# Patient Record
Sex: Male | Born: 1945
Health system: Southern US, Community
[De-identification: ages and names within clinical notes are randomized; demographics above are authoritative.]

## PROBLEM LIST (undated history)

## (undated) DIAGNOSIS — I10 Essential (primary) hypertension: Secondary | ICD-10-CM

## (undated) HISTORY — DX: Essential (primary) hypertension: I10

---

## 2002-06-10 ENCOUNTER — Encounter: Payer: Self-pay | Admitting: Family Medicine

## 2002-06-10 ENCOUNTER — Encounter: Admission: RE | Admit: 2002-06-10 | Discharge: 2002-06-10 | Payer: Self-pay | Admitting: Family Medicine

## 2005-06-23 ENCOUNTER — Encounter: Admission: RE | Admit: 2005-06-23 | Discharge: 2005-06-23 | Payer: Self-pay | Admitting: Family Medicine

## 2006-07-07 ENCOUNTER — Ambulatory Visit (HOSPITAL_COMMUNITY): Admission: RE | Admit: 2006-07-07 | Discharge: 2006-07-07 | Payer: Self-pay | Admitting: Family Medicine

## 2006-07-07 ENCOUNTER — Encounter (INDEPENDENT_AMBULATORY_CARE_PROVIDER_SITE_OTHER): Payer: Self-pay | Admitting: Cardiology

## 2006-09-24 IMAGING — CR DG CERVICAL SPINE COMPLETE 4+V
6 series · 6 of 6 positions shown · non-contrast
Comparison: none

CLINICAL DATA: Intermittent pain and tingling left upper extremity, left shoulder, and elbow pain.  Left neck pain.  Loss of strength in left arm. 
CERVICAL SPINE SERIES ? 6 VIEW:
Disc space narrowing at C6-7 and to a lesser degree at C5-6.   Vertebral body osteophytic formation mainly off of the posteroinferior aspects of C-5 and C-6 bodies.  Sagittal dimension bony central canal appears adequate.  ild foraminal encroachment by osteophytes at C4-5, C5-6, and C6-7 on the right and on the left, mainly at C5-6 and to a lesser degree at C6-7.  No subluxation.

[view not recorded (1 of 6)]
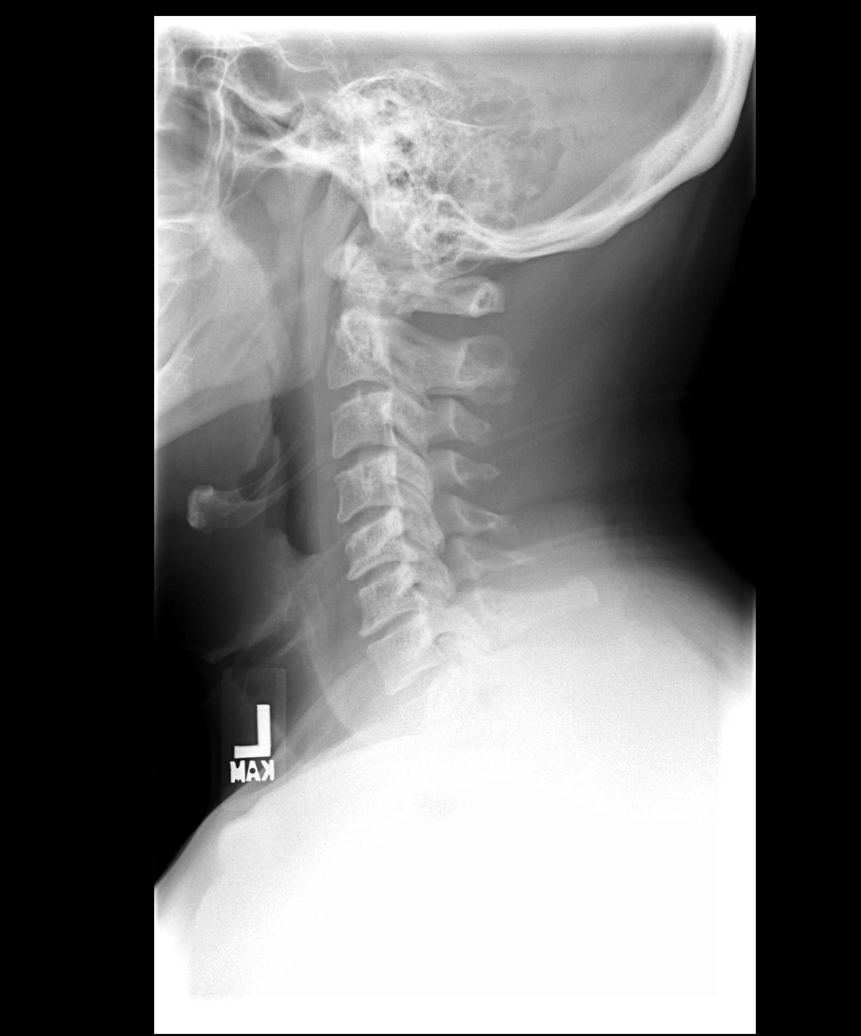

[view not recorded (2 of 6)]
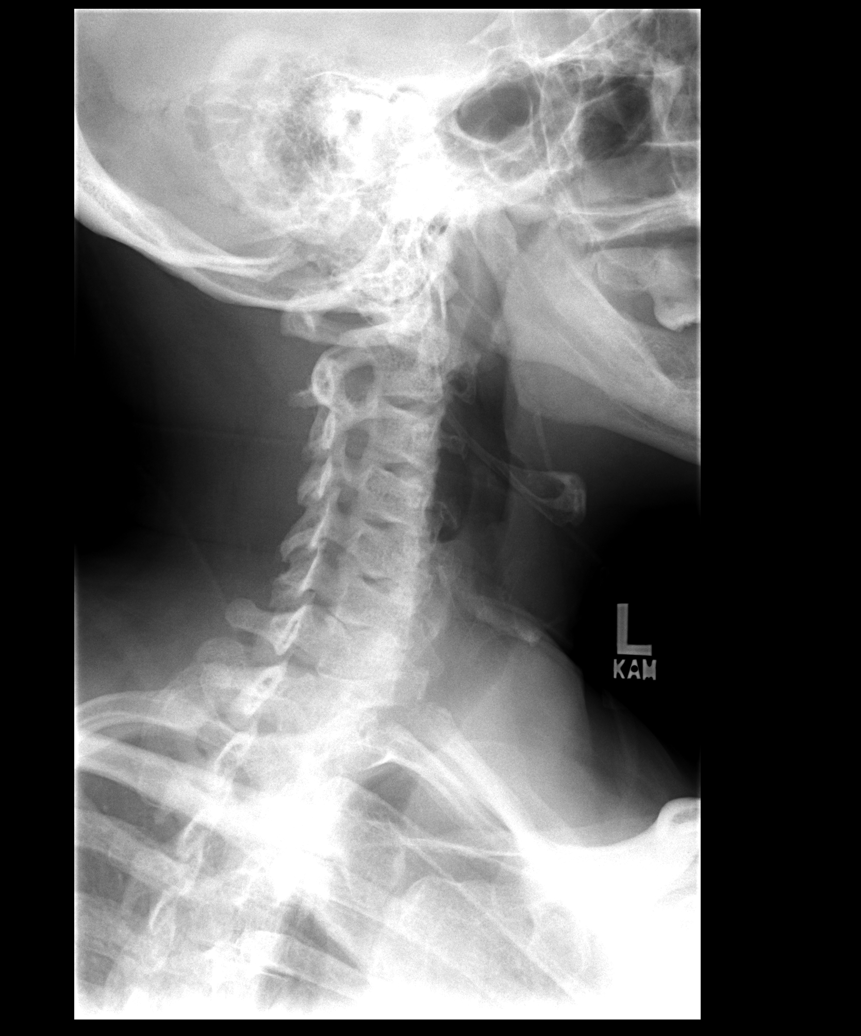

[view not recorded (3 of 6)]
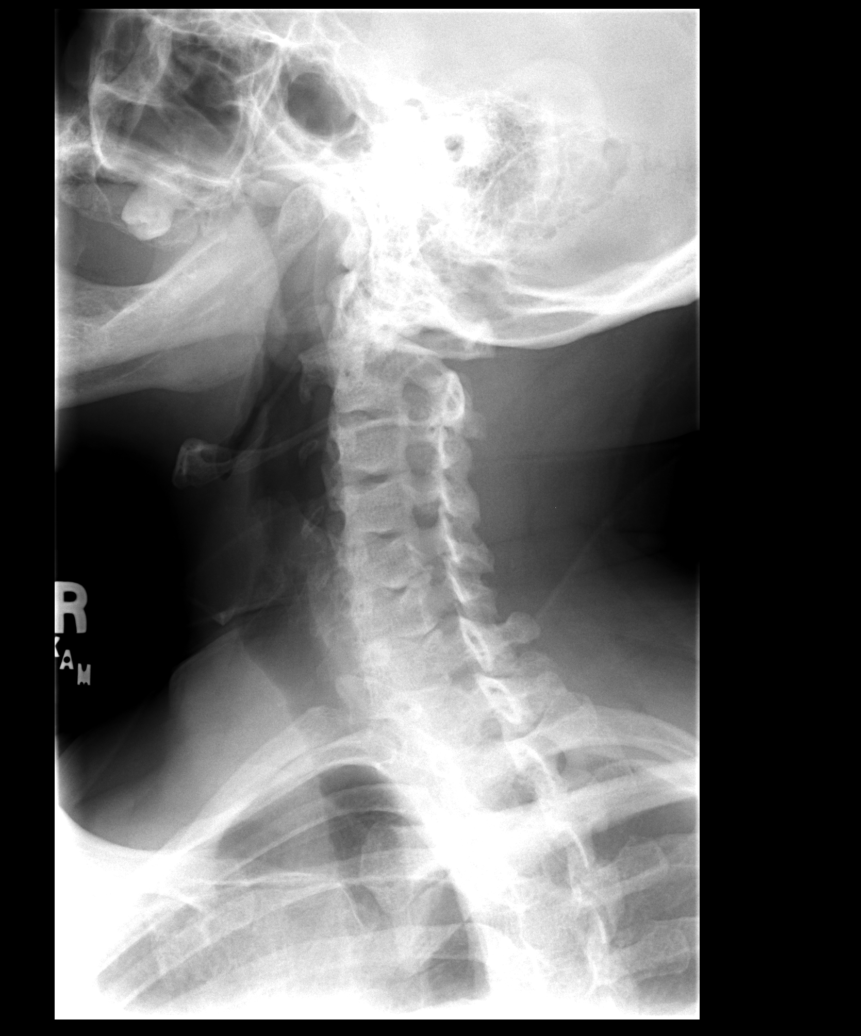

[view not recorded (4 of 6)]
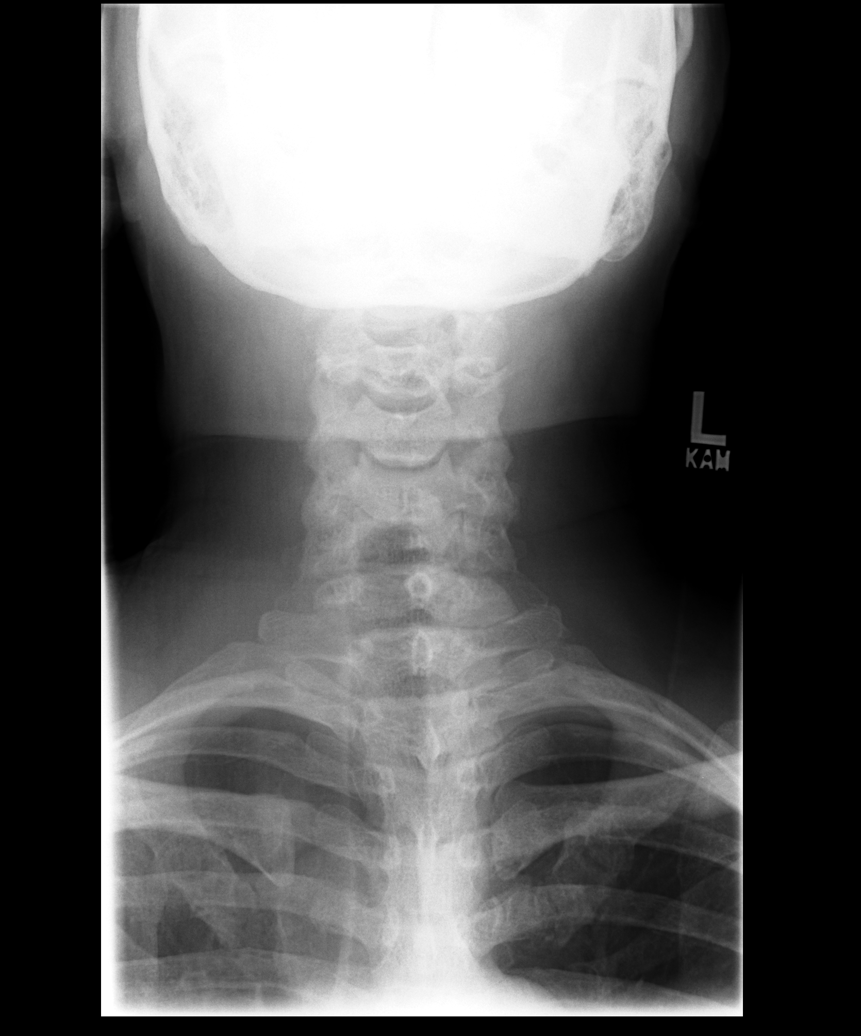

[view not recorded (5 of 6)]
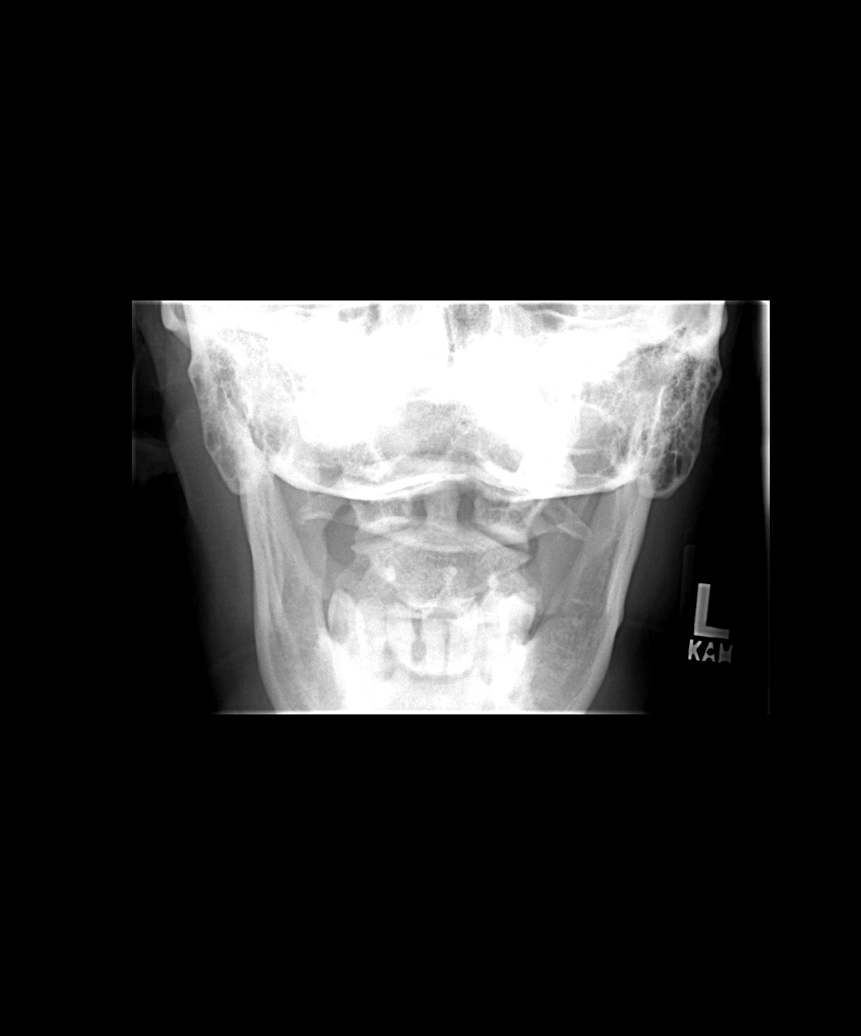

[view not recorded (6 of 6)]
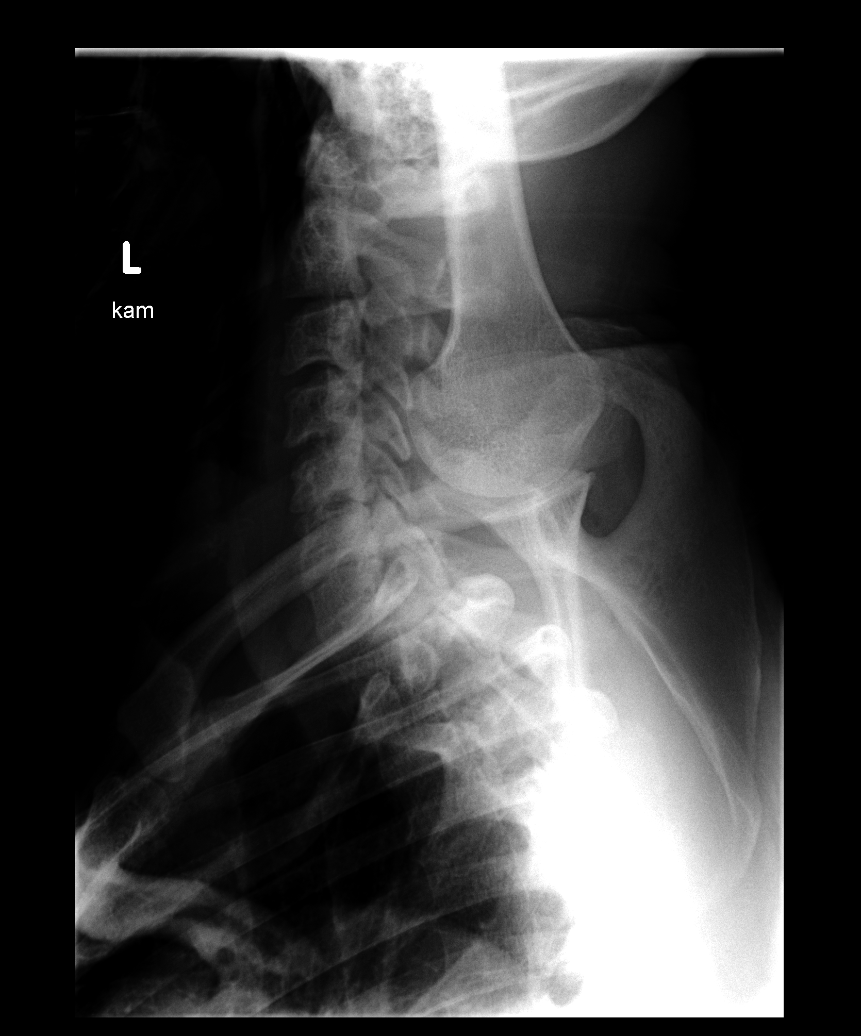

[6 of 6 positions shown; findings below may reference images not displayed]

IMPRESSION: Degenerative spondylotic changes as described above.   Given the patient?s history, note that there is foraminal stenosis on the left at C5-6.

## 2007-12-31 ENCOUNTER — Emergency Department (HOSPITAL_COMMUNITY): Admission: EM | Admit: 2007-12-31 | Discharge: 2007-12-31 | Payer: Self-pay | Admitting: Emergency Medicine

## 2007-12-31 ENCOUNTER — Encounter: Admission: RE | Admit: 2007-12-31 | Discharge: 2007-12-31 | Payer: Self-pay | Admitting: Family Medicine

## 2011-08-04 LAB — CBC
MCHC: 32.8
MCV: 83.8
RDW: 12.7

## 2011-08-04 LAB — COMPREHENSIVE METABOLIC PANEL
ALT: 37
AST: 27
CO2: 31
Calcium: 9.1
Creatinine, Ser: 1.48
GFR calc Af Amer: 58 — ABNORMAL LOW
GFR calc non Af Amer: 48 — ABNORMAL LOW
Glucose, Bld: 120 — ABNORMAL HIGH
Sodium: 140
Total Protein: 7.1

## 2011-08-04 LAB — URINALYSIS, ROUTINE W REFLEX MICROSCOPIC
Glucose, UA: NEGATIVE
Nitrite: NEGATIVE
Specific Gravity, Urine: 1.006
pH: 6

## 2011-08-04 LAB — DIFFERENTIAL
Eosinophils Absolute: 0
Lymphocytes Relative: 15
Lymphs Abs: 1.3
Monocytes Relative: 9
Neutrophils Relative %: 76

## 2011-08-04 LAB — LIPASE, BLOOD: Lipase: 27

## 2014-12-27 DIAGNOSIS — C61 Malignant neoplasm of prostate: Secondary | ICD-10-CM | POA: Diagnosis not present

## 2014-12-27 DIAGNOSIS — I898 Other specified noninfective disorders of lymphatic vessels and lymph nodes: Secondary | ICD-10-CM | POA: Diagnosis not present

## 2014-12-27 DIAGNOSIS — Z6829 Body mass index (BMI) 29.0-29.9, adult: Secondary | ICD-10-CM | POA: Diagnosis not present

## 2014-12-27 DIAGNOSIS — N529 Male erectile dysfunction, unspecified: Secondary | ICD-10-CM | POA: Diagnosis not present

## 2015-02-21 DIAGNOSIS — I10 Essential (primary) hypertension: Secondary | ICD-10-CM | POA: Diagnosis not present

## 2015-02-27 DIAGNOSIS — J069 Acute upper respiratory infection, unspecified: Secondary | ICD-10-CM | POA: Diagnosis not present

## 2015-03-09 DIAGNOSIS — J069 Acute upper respiratory infection, unspecified: Secondary | ICD-10-CM | POA: Diagnosis not present

## 2015-04-25 DIAGNOSIS — C61 Malignant neoplasm of prostate: Secondary | ICD-10-CM | POA: Diagnosis not present

## 2015-04-25 DIAGNOSIS — Z6829 Body mass index (BMI) 29.0-29.9, adult: Secondary | ICD-10-CM | POA: Diagnosis not present

## 2015-04-27 DIAGNOSIS — E782 Mixed hyperlipidemia: Secondary | ICD-10-CM | POA: Diagnosis not present

## 2015-04-27 DIAGNOSIS — R0602 Shortness of breath: Secondary | ICD-10-CM | POA: Diagnosis not present

## 2015-04-27 DIAGNOSIS — I10 Essential (primary) hypertension: Secondary | ICD-10-CM | POA: Diagnosis not present

## 2015-04-27 DIAGNOSIS — F064 Anxiety disorder due to known physiological condition: Secondary | ICD-10-CM | POA: Diagnosis not present

## 2015-05-29 DIAGNOSIS — C61 Malignant neoplasm of prostate: Secondary | ICD-10-CM | POA: Diagnosis not present

## 2015-05-29 DIAGNOSIS — I10 Essential (primary) hypertension: Secondary | ICD-10-CM | POA: Diagnosis not present

## 2015-05-29 DIAGNOSIS — F064 Anxiety disorder due to known physiological condition: Secondary | ICD-10-CM | POA: Diagnosis not present

## 2015-05-29 DIAGNOSIS — E782 Mixed hyperlipidemia: Secondary | ICD-10-CM | POA: Diagnosis not present

## 2015-07-04 DIAGNOSIS — C61 Malignant neoplasm of prostate: Secondary | ICD-10-CM | POA: Diagnosis not present

## 2015-07-04 DIAGNOSIS — N471 Phimosis: Secondary | ICD-10-CM | POA: Diagnosis not present

## 2015-07-04 DIAGNOSIS — N529 Male erectile dysfunction, unspecified: Secondary | ICD-10-CM | POA: Diagnosis not present

## 2015-07-04 DIAGNOSIS — I898 Other specified noninfective disorders of lymphatic vessels and lymph nodes: Secondary | ICD-10-CM | POA: Diagnosis not present

## 2015-07-04 DIAGNOSIS — Z6829 Body mass index (BMI) 29.0-29.9, adult: Secondary | ICD-10-CM | POA: Diagnosis not present

## 2015-09-26 DIAGNOSIS — R7309 Other abnormal glucose: Secondary | ICD-10-CM | POA: Diagnosis not present

## 2015-09-26 DIAGNOSIS — Z6827 Body mass index (BMI) 27.0-27.9, adult: Secondary | ICD-10-CM | POA: Diagnosis not present

## 2015-09-26 DIAGNOSIS — R0602 Shortness of breath: Secondary | ICD-10-CM | POA: Diagnosis not present

## 2015-09-26 DIAGNOSIS — I10 Essential (primary) hypertension: Secondary | ICD-10-CM | POA: Diagnosis not present

## 2015-09-26 DIAGNOSIS — N189 Chronic kidney disease, unspecified: Secondary | ICD-10-CM | POA: Diagnosis not present

## 2015-10-15 DIAGNOSIS — C61 Malignant neoplasm of prostate: Secondary | ICD-10-CM | POA: Diagnosis not present

## 2015-10-15 DIAGNOSIS — Z6828 Body mass index (BMI) 28.0-28.9, adult: Secondary | ICD-10-CM | POA: Diagnosis not present

## 2016-01-16 DIAGNOSIS — Z8546 Personal history of malignant neoplasm of prostate: Secondary | ICD-10-CM | POA: Diagnosis not present

## 2016-01-16 DIAGNOSIS — N529 Male erectile dysfunction, unspecified: Secondary | ICD-10-CM | POA: Diagnosis not present

## 2016-01-16 DIAGNOSIS — I898 Other specified noninfective disorders of lymphatic vessels and lymph nodes: Secondary | ICD-10-CM | POA: Diagnosis not present

## 2016-01-29 DIAGNOSIS — H6123 Impacted cerumen, bilateral: Secondary | ICD-10-CM | POA: Diagnosis not present

## 2016-01-29 DIAGNOSIS — N189 Chronic kidney disease, unspecified: Secondary | ICD-10-CM | POA: Diagnosis not present

## 2016-01-29 DIAGNOSIS — I10 Essential (primary) hypertension: Secondary | ICD-10-CM | POA: Diagnosis not present

## 2016-01-29 DIAGNOSIS — R7309 Other abnormal glucose: Secondary | ICD-10-CM | POA: Diagnosis not present

## 2016-02-22 DIAGNOSIS — R531 Weakness: Secondary | ICD-10-CM | POA: Diagnosis not present

## 2016-02-22 DIAGNOSIS — R55 Syncope and collapse: Secondary | ICD-10-CM | POA: Diagnosis not present

## 2016-02-22 DIAGNOSIS — J9811 Atelectasis: Secondary | ICD-10-CM | POA: Diagnosis not present

## 2016-02-22 DIAGNOSIS — A419 Sepsis, unspecified organism: Secondary | ICD-10-CM | POA: Diagnosis not present

## 2016-02-22 DIAGNOSIS — R42 Dizziness and giddiness: Secondary | ICD-10-CM | POA: Diagnosis not present

## 2016-02-22 DIAGNOSIS — R569 Unspecified convulsions: Secondary | ICD-10-CM | POA: Diagnosis not present

## 2016-02-23 DIAGNOSIS — R531 Weakness: Secondary | ICD-10-CM | POA: Diagnosis not present

## 2016-02-23 DIAGNOSIS — J9811 Atelectasis: Secondary | ICD-10-CM | POA: Diagnosis not present

## 2016-02-23 DIAGNOSIS — E872 Acidosis: Secondary | ICD-10-CM | POA: Diagnosis not present

## 2016-02-23 DIAGNOSIS — E785 Hyperlipidemia, unspecified: Secondary | ICD-10-CM | POA: Diagnosis not present

## 2016-02-23 DIAGNOSIS — I517 Cardiomegaly: Secondary | ICD-10-CM | POA: Diagnosis not present

## 2016-02-23 DIAGNOSIS — N183 Chronic kidney disease, stage 3 (moderate): Secondary | ICD-10-CM | POA: Diagnosis not present

## 2016-02-23 DIAGNOSIS — K449 Diaphragmatic hernia without obstruction or gangrene: Secondary | ICD-10-CM | POA: Diagnosis not present

## 2016-02-23 DIAGNOSIS — R55 Syncope and collapse: Secondary | ICD-10-CM | POA: Diagnosis not present

## 2016-02-23 DIAGNOSIS — A419 Sepsis, unspecified organism: Secondary | ICD-10-CM | POA: Diagnosis not present

## 2016-02-23 DIAGNOSIS — I2510 Atherosclerotic heart disease of native coronary artery without angina pectoris: Secondary | ICD-10-CM | POA: Diagnosis not present

## 2016-02-23 DIAGNOSIS — I340 Nonrheumatic mitral (valve) insufficiency: Secondary | ICD-10-CM | POA: Diagnosis not present

## 2016-02-23 DIAGNOSIS — I10 Essential (primary) hypertension: Secondary | ICD-10-CM | POA: Diagnosis not present

## 2016-02-23 DIAGNOSIS — R42 Dizziness and giddiness: Secondary | ICD-10-CM | POA: Diagnosis not present

## 2016-02-23 DIAGNOSIS — E876 Hypokalemia: Secondary | ICD-10-CM | POA: Diagnosis not present

## 2016-02-23 DIAGNOSIS — E869 Volume depletion, unspecified: Secondary | ICD-10-CM | POA: Diagnosis not present

## 2016-02-23 DIAGNOSIS — K529 Noninfective gastroenteritis and colitis, unspecified: Secondary | ICD-10-CM | POA: Diagnosis not present

## 2016-02-23 DIAGNOSIS — E7800 Pure hypercholesterolemia, unspecified: Secondary | ICD-10-CM | POA: Diagnosis not present

## 2016-02-23 DIAGNOSIS — I129 Hypertensive chronic kidney disease with stage 1 through stage 4 chronic kidney disease, or unspecified chronic kidney disease: Secondary | ICD-10-CM | POA: Diagnosis not present

## 2016-02-23 DIAGNOSIS — Z951 Presence of aortocoronary bypass graft: Secondary | ICD-10-CM | POA: Diagnosis not present

## 2016-02-23 DIAGNOSIS — Z79899 Other long term (current) drug therapy: Secondary | ICD-10-CM | POA: Diagnosis not present

## 2016-02-26 DIAGNOSIS — E782 Mixed hyperlipidemia: Secondary | ICD-10-CM | POA: Diagnosis not present

## 2016-02-26 DIAGNOSIS — R55 Syncope and collapse: Secondary | ICD-10-CM | POA: Diagnosis not present

## 2016-02-26 DIAGNOSIS — R7309 Other abnormal glucose: Secondary | ICD-10-CM | POA: Diagnosis not present

## 2016-02-26 DIAGNOSIS — I10 Essential (primary) hypertension: Secondary | ICD-10-CM | POA: Diagnosis not present

## 2016-03-19 DIAGNOSIS — R7309 Other abnormal glucose: Secondary | ICD-10-CM | POA: Diagnosis not present

## 2016-03-19 DIAGNOSIS — I10 Essential (primary) hypertension: Secondary | ICD-10-CM | POA: Diagnosis not present

## 2016-03-19 DIAGNOSIS — R55 Syncope and collapse: Secondary | ICD-10-CM | POA: Diagnosis not present

## 2016-03-19 DIAGNOSIS — E785 Hyperlipidemia, unspecified: Secondary | ICD-10-CM | POA: Diagnosis not present

## 2016-03-26 DIAGNOSIS — I2510 Atherosclerotic heart disease of native coronary artery without angina pectoris: Secondary | ICD-10-CM | POA: Diagnosis not present

## 2016-03-26 DIAGNOSIS — I10 Essential (primary) hypertension: Secondary | ICD-10-CM | POA: Diagnosis not present

## 2016-03-26 DIAGNOSIS — R55 Syncope and collapse: Secondary | ICD-10-CM | POA: Diagnosis not present

## 2016-04-23 DIAGNOSIS — C61 Malignant neoplasm of prostate: Secondary | ICD-10-CM | POA: Diagnosis not present

## 2016-04-25 DIAGNOSIS — R9721 Rising PSA following treatment for malignant neoplasm of prostate: Secondary | ICD-10-CM | POA: Diagnosis not present

## 2016-04-25 DIAGNOSIS — C61 Malignant neoplasm of prostate: Secondary | ICD-10-CM | POA: Diagnosis not present

## 2016-04-25 DIAGNOSIS — Z923 Personal history of irradiation: Secondary | ICD-10-CM | POA: Diagnosis not present

## 2016-04-25 DIAGNOSIS — Z08 Encounter for follow-up examination after completed treatment for malignant neoplasm: Secondary | ICD-10-CM | POA: Diagnosis not present

## 2016-05-30 DIAGNOSIS — I10 Essential (primary) hypertension: Secondary | ICD-10-CM | POA: Diagnosis not present

## 2016-05-30 DIAGNOSIS — E782 Mixed hyperlipidemia: Secondary | ICD-10-CM | POA: Diagnosis not present

## 2016-05-30 DIAGNOSIS — R7309 Other abnormal glucose: Secondary | ICD-10-CM | POA: Diagnosis not present

## 2016-05-30 DIAGNOSIS — N189 Chronic kidney disease, unspecified: Secondary | ICD-10-CM | POA: Diagnosis not present

## 2016-05-30 DIAGNOSIS — E872 Acidosis: Secondary | ICD-10-CM | POA: Diagnosis not present

## 2016-07-15 DIAGNOSIS — R143 Flatulence: Secondary | ICD-10-CM | POA: Diagnosis not present

## 2016-08-29 DIAGNOSIS — I10 Essential (primary) hypertension: Secondary | ICD-10-CM | POA: Diagnosis not present

## 2016-08-29 DIAGNOSIS — Z23 Encounter for immunization: Secondary | ICD-10-CM | POA: Diagnosis not present

## 2016-08-29 DIAGNOSIS — E785 Hyperlipidemia, unspecified: Secondary | ICD-10-CM | POA: Diagnosis not present

## 2016-08-29 DIAGNOSIS — M189 Osteoarthritis of first carpometacarpal joint, unspecified: Secondary | ICD-10-CM | POA: Diagnosis not present

## 2016-08-29 DIAGNOSIS — E782 Mixed hyperlipidemia: Secondary | ICD-10-CM | POA: Diagnosis not present

## 2016-08-29 DIAGNOSIS — R7309 Other abnormal glucose: Secondary | ICD-10-CM | POA: Diagnosis not present

## 2016-09-22 DIAGNOSIS — I2510 Atherosclerotic heart disease of native coronary artery without angina pectoris: Secondary | ICD-10-CM | POA: Diagnosis not present

## 2016-09-22 DIAGNOSIS — E785 Hyperlipidemia, unspecified: Secondary | ICD-10-CM | POA: Diagnosis not present

## 2016-09-22 DIAGNOSIS — R55 Syncope and collapse: Secondary | ICD-10-CM | POA: Diagnosis not present

## 2016-09-22 DIAGNOSIS — I10 Essential (primary) hypertension: Secondary | ICD-10-CM | POA: Diagnosis not present

## 2016-10-20 DIAGNOSIS — I898 Other specified noninfective disorders of lymphatic vessels and lymph nodes: Secondary | ICD-10-CM | POA: Diagnosis not present

## 2016-10-29 DIAGNOSIS — C61 Malignant neoplasm of prostate: Secondary | ICD-10-CM | POA: Diagnosis not present

## 2016-10-31 DIAGNOSIS — E782 Mixed hyperlipidemia: Secondary | ICD-10-CM | POA: Diagnosis not present

## 2016-10-31 DIAGNOSIS — N189 Chronic kidney disease, unspecified: Secondary | ICD-10-CM | POA: Diagnosis not present

## 2016-10-31 DIAGNOSIS — I10 Essential (primary) hypertension: Secondary | ICD-10-CM | POA: Diagnosis not present

## 2016-12-03 DIAGNOSIS — I10 Essential (primary) hypertension: Secondary | ICD-10-CM | POA: Diagnosis not present

## 2016-12-03 DIAGNOSIS — E785 Hyperlipidemia, unspecified: Secondary | ICD-10-CM | POA: Diagnosis not present

## 2017-01-02 DIAGNOSIS — E785 Hyperlipidemia, unspecified: Secondary | ICD-10-CM | POA: Diagnosis not present

## 2017-01-02 DIAGNOSIS — R7309 Other abnormal glucose: Secondary | ICD-10-CM | POA: Diagnosis not present

## 2017-01-02 DIAGNOSIS — I10 Essential (primary) hypertension: Secondary | ICD-10-CM | POA: Diagnosis not present

## 2017-01-02 DIAGNOSIS — M189 Osteoarthritis of first carpometacarpal joint, unspecified: Secondary | ICD-10-CM | POA: Diagnosis not present

## 2017-01-02 DIAGNOSIS — Z0000 Encounter for general adult medical examination without abnormal findings: Secondary | ICD-10-CM | POA: Diagnosis not present

## 2017-01-12 DIAGNOSIS — M2140 Flat foot [pes planus] (acquired), unspecified foot: Secondary | ICD-10-CM | POA: Diagnosis not present

## 2017-01-12 DIAGNOSIS — M79671 Pain in right foot: Secondary | ICD-10-CM | POA: Diagnosis not present

## 2017-01-12 DIAGNOSIS — R252 Cramp and spasm: Secondary | ICD-10-CM | POA: Diagnosis not present

## 2017-01-12 DIAGNOSIS — M79672 Pain in left foot: Secondary | ICD-10-CM | POA: Diagnosis not present

## 2017-02-17 DIAGNOSIS — G5761 Lesion of plantar nerve, right lower limb: Secondary | ICD-10-CM | POA: Diagnosis not present

## 2017-02-17 DIAGNOSIS — G5762 Lesion of plantar nerve, left lower limb: Secondary | ICD-10-CM | POA: Diagnosis not present

## 2017-02-17 DIAGNOSIS — M79672 Pain in left foot: Secondary | ICD-10-CM | POA: Diagnosis not present

## 2017-02-17 DIAGNOSIS — M79671 Pain in right foot: Secondary | ICD-10-CM | POA: Diagnosis not present

## 2017-04-07 DIAGNOSIS — C61 Malignant neoplasm of prostate: Secondary | ICD-10-CM | POA: Diagnosis not present

## 2017-04-07 DIAGNOSIS — I10 Essential (primary) hypertension: Secondary | ICD-10-CM | POA: Diagnosis not present

## 2017-04-07 DIAGNOSIS — E782 Mixed hyperlipidemia: Secondary | ICD-10-CM | POA: Diagnosis not present

## 2017-04-07 DIAGNOSIS — E872 Acidosis: Secondary | ICD-10-CM | POA: Diagnosis not present

## 2017-04-07 DIAGNOSIS — N189 Chronic kidney disease, unspecified: Secondary | ICD-10-CM | POA: Diagnosis not present

## 2017-04-07 DIAGNOSIS — H6121 Impacted cerumen, right ear: Secondary | ICD-10-CM | POA: Diagnosis not present

## 2017-04-15 DIAGNOSIS — H6123 Impacted cerumen, bilateral: Secondary | ICD-10-CM | POA: Diagnosis not present

## 2017-07-07 DIAGNOSIS — I10 Essential (primary) hypertension: Secondary | ICD-10-CM | POA: Diagnosis not present

## 2017-07-07 DIAGNOSIS — N184 Chronic kidney disease, stage 4 (severe): Secondary | ICD-10-CM | POA: Diagnosis not present

## 2017-07-07 DIAGNOSIS — N189 Chronic kidney disease, unspecified: Secondary | ICD-10-CM | POA: Diagnosis not present

## 2017-07-07 DIAGNOSIS — E782 Mixed hyperlipidemia: Secondary | ICD-10-CM | POA: Diagnosis not present

## 2017-10-05 DIAGNOSIS — I2510 Atherosclerotic heart disease of native coronary artery without angina pectoris: Secondary | ICD-10-CM | POA: Diagnosis not present

## 2017-10-05 DIAGNOSIS — E782 Mixed hyperlipidemia: Secondary | ICD-10-CM | POA: Diagnosis not present

## 2017-10-05 DIAGNOSIS — I10 Essential (primary) hypertension: Secondary | ICD-10-CM | POA: Diagnosis not present

## 2017-11-17 DIAGNOSIS — C61 Malignant neoplasm of prostate: Secondary | ICD-10-CM | POA: Diagnosis not present

## 2017-11-17 DIAGNOSIS — N189 Chronic kidney disease, unspecified: Secondary | ICD-10-CM | POA: Diagnosis not present

## 2017-11-17 DIAGNOSIS — I10 Essential (primary) hypertension: Secondary | ICD-10-CM | POA: Diagnosis not present

## 2017-11-17 DIAGNOSIS — I209 Angina pectoris, unspecified: Secondary | ICD-10-CM | POA: Diagnosis not present

## 2017-11-17 DIAGNOSIS — E118 Type 2 diabetes mellitus with unspecified complications: Secondary | ICD-10-CM | POA: Diagnosis not present

## 2017-11-17 DIAGNOSIS — E782 Mixed hyperlipidemia: Secondary | ICD-10-CM | POA: Diagnosis not present

## 2017-11-20 DIAGNOSIS — C61 Malignant neoplasm of prostate: Secondary | ICD-10-CM | POA: Diagnosis not present

## 2017-12-18 DIAGNOSIS — I209 Angina pectoris, unspecified: Secondary | ICD-10-CM | POA: Diagnosis not present

## 2017-12-25 DIAGNOSIS — I517 Cardiomegaly: Secondary | ICD-10-CM | POA: Diagnosis not present

## 2017-12-25 DIAGNOSIS — R000 Tachycardia, unspecified: Secondary | ICD-10-CM | POA: Diagnosis not present

## 2017-12-25 DIAGNOSIS — R9431 Abnormal electrocardiogram [ECG] [EKG]: Secondary | ICD-10-CM | POA: Diagnosis not present

## 2018-01-11 DIAGNOSIS — I10 Essential (primary) hypertension: Secondary | ICD-10-CM | POA: Diagnosis not present

## 2018-01-11 DIAGNOSIS — J209 Acute bronchitis, unspecified: Secondary | ICD-10-CM | POA: Diagnosis not present

## 2018-01-28 DIAGNOSIS — J208 Acute bronchitis due to other specified organisms: Secondary | ICD-10-CM | POA: Diagnosis not present

## 2018-01-28 DIAGNOSIS — Z0000 Encounter for general adult medical examination without abnormal findings: Secondary | ICD-10-CM | POA: Diagnosis not present

## 2018-02-09 DIAGNOSIS — I10 Essential (primary) hypertension: Secondary | ICD-10-CM | POA: Diagnosis not present

## 2018-02-09 DIAGNOSIS — I2510 Atherosclerotic heart disease of native coronary artery without angina pectoris: Secondary | ICD-10-CM | POA: Diagnosis not present

## 2018-02-09 DIAGNOSIS — E782 Mixed hyperlipidemia: Secondary | ICD-10-CM | POA: Diagnosis not present

## 2018-03-12 DIAGNOSIS — J208 Acute bronchitis due to other specified organisms: Secondary | ICD-10-CM | POA: Diagnosis not present

## 2018-06-18 DIAGNOSIS — E785 Hyperlipidemia, unspecified: Secondary | ICD-10-CM | POA: Diagnosis not present

## 2018-06-18 DIAGNOSIS — I10 Essential (primary) hypertension: Secondary | ICD-10-CM | POA: Diagnosis not present

## 2018-06-18 DIAGNOSIS — E118 Type 2 diabetes mellitus with unspecified complications: Secondary | ICD-10-CM | POA: Diagnosis not present

## 2018-06-18 DIAGNOSIS — L601 Onycholysis: Secondary | ICD-10-CM | POA: Diagnosis not present

## 2018-06-18 DIAGNOSIS — I209 Angina pectoris, unspecified: Secondary | ICD-10-CM | POA: Diagnosis not present

## 2018-06-18 DIAGNOSIS — E782 Mixed hyperlipidemia: Secondary | ICD-10-CM | POA: Diagnosis not present

## 2018-07-07 DIAGNOSIS — M79672 Pain in left foot: Secondary | ICD-10-CM | POA: Diagnosis not present

## 2018-07-07 DIAGNOSIS — M79671 Pain in right foot: Secondary | ICD-10-CM | POA: Diagnosis not present

## 2018-07-07 DIAGNOSIS — M150 Primary generalized (osteo)arthritis: Secondary | ICD-10-CM | POA: Diagnosis not present

## 2018-08-27 DIAGNOSIS — I10 Essential (primary) hypertension: Secondary | ICD-10-CM | POA: Diagnosis not present

## 2018-08-27 DIAGNOSIS — I2510 Atherosclerotic heart disease of native coronary artery without angina pectoris: Secondary | ICD-10-CM | POA: Diagnosis not present

## 2018-08-27 DIAGNOSIS — R079 Chest pain, unspecified: Secondary | ICD-10-CM | POA: Diagnosis not present

## 2018-08-27 DIAGNOSIS — E782 Mixed hyperlipidemia: Secondary | ICD-10-CM | POA: Diagnosis not present

## 2018-09-17 DIAGNOSIS — I10 Essential (primary) hypertension: Secondary | ICD-10-CM | POA: Diagnosis not present

## 2018-09-17 DIAGNOSIS — E785 Hyperlipidemia, unspecified: Secondary | ICD-10-CM | POA: Diagnosis not present

## 2018-09-17 DIAGNOSIS — M189 Osteoarthritis of first carpometacarpal joint, unspecified: Secondary | ICD-10-CM | POA: Diagnosis not present

## 2018-09-27 DIAGNOSIS — Z86010 Personal history of colonic polyps: Secondary | ICD-10-CM | POA: Diagnosis not present

## 2018-09-27 DIAGNOSIS — I10 Essential (primary) hypertension: Secondary | ICD-10-CM | POA: Diagnosis not present

## 2018-09-27 DIAGNOSIS — E782 Mixed hyperlipidemia: Secondary | ICD-10-CM | POA: Diagnosis not present

## 2018-10-26 DIAGNOSIS — K635 Polyp of colon: Secondary | ICD-10-CM | POA: Diagnosis not present

## 2018-10-26 DIAGNOSIS — K5730 Diverticulosis of large intestine without perforation or abscess without bleeding: Secondary | ICD-10-CM | POA: Diagnosis not present

## 2018-10-26 DIAGNOSIS — R9431 Abnormal electrocardiogram [ECG] [EKG]: Secondary | ICD-10-CM | POA: Diagnosis not present

## 2018-10-26 DIAGNOSIS — D123 Benign neoplasm of transverse colon: Secondary | ICD-10-CM | POA: Diagnosis not present

## 2018-10-26 DIAGNOSIS — D124 Benign neoplasm of descending colon: Secondary | ICD-10-CM | POA: Diagnosis not present

## 2018-10-26 DIAGNOSIS — Z86010 Personal history of colonic polyps: Secondary | ICD-10-CM | POA: Diagnosis not present

## 2018-12-21 DIAGNOSIS — E1100 Type 2 diabetes mellitus with hyperosmolarity without nonketotic hyperglycemic-hyperosmolar coma (NKHHC): Secondary | ICD-10-CM | POA: Diagnosis not present

## 2018-12-21 DIAGNOSIS — I10 Essential (primary) hypertension: Secondary | ICD-10-CM | POA: Diagnosis not present

## 2018-12-21 DIAGNOSIS — M130 Polyarthritis, unspecified: Secondary | ICD-10-CM | POA: Diagnosis not present

## 2018-12-21 DIAGNOSIS — E1140 Type 2 diabetes mellitus with diabetic neuropathy, unspecified: Secondary | ICD-10-CM | POA: Diagnosis not present

## 2018-12-29 DIAGNOSIS — M542 Cervicalgia: Secondary | ICD-10-CM | POA: Diagnosis not present

## 2018-12-29 DIAGNOSIS — S161XXA Strain of muscle, fascia and tendon at neck level, initial encounter: Secondary | ICD-10-CM | POA: Diagnosis not present

## 2018-12-29 DIAGNOSIS — S169XXA Unspecified injury of muscle, fascia and tendon at neck level, initial encounter: Secondary | ICD-10-CM | POA: Diagnosis not present

## 2018-12-29 DIAGNOSIS — S168XXA Other specified injury of muscle, fascia and tendon at neck level, initial encounter: Secondary | ICD-10-CM | POA: Diagnosis not present

## 2019-01-14 DIAGNOSIS — E782 Mixed hyperlipidemia: Secondary | ICD-10-CM | POA: Diagnosis not present

## 2019-01-14 DIAGNOSIS — E1169 Type 2 diabetes mellitus with other specified complication: Secondary | ICD-10-CM | POA: Diagnosis not present

## 2019-01-14 DIAGNOSIS — I10 Essential (primary) hypertension: Secondary | ICD-10-CM | POA: Diagnosis not present

## 2019-01-14 DIAGNOSIS — N189 Chronic kidney disease, unspecified: Secondary | ICD-10-CM | POA: Diagnosis not present

## 2019-01-14 DIAGNOSIS — Z0000 Encounter for general adult medical examination without abnormal findings: Secondary | ICD-10-CM | POA: Diagnosis not present

## 2019-01-21 DIAGNOSIS — M542 Cervicalgia: Secondary | ICD-10-CM | POA: Diagnosis not present

## 2019-01-21 DIAGNOSIS — S168XXA Other specified injury of muscle, fascia and tendon at neck level, initial encounter: Secondary | ICD-10-CM | POA: Diagnosis not present

## 2019-01-21 DIAGNOSIS — S161XXA Strain of muscle, fascia and tendon at neck level, initial encounter: Secondary | ICD-10-CM | POA: Diagnosis not present

## 2019-01-21 DIAGNOSIS — S169XXA Unspecified injury of muscle, fascia and tendon at neck level, initial encounter: Secondary | ICD-10-CM | POA: Diagnosis not present

## 2019-04-19 DIAGNOSIS — H6123 Impacted cerumen, bilateral: Secondary | ICD-10-CM | POA: Diagnosis not present

## 2019-04-19 DIAGNOSIS — H93293 Other abnormal auditory perceptions, bilateral: Secondary | ICD-10-CM | POA: Diagnosis not present

## 2019-04-22 DIAGNOSIS — I10 Essential (primary) hypertension: Secondary | ICD-10-CM | POA: Diagnosis not present

## 2019-05-20 DIAGNOSIS — E1169 Type 2 diabetes mellitus with other specified complication: Secondary | ICD-10-CM | POA: Diagnosis not present

## 2019-05-20 DIAGNOSIS — E785 Hyperlipidemia, unspecified: Secondary | ICD-10-CM | POA: Diagnosis not present

## 2019-05-20 DIAGNOSIS — R143 Flatulence: Secondary | ICD-10-CM | POA: Diagnosis not present

## 2019-05-23 DIAGNOSIS — Z9109 Other allergy status, other than to drugs and biological substances: Secondary | ICD-10-CM | POA: Diagnosis not present

## 2019-05-23 DIAGNOSIS — H6123 Impacted cerumen, bilateral: Secondary | ICD-10-CM | POA: Diagnosis not present

## 2019-06-28 DIAGNOSIS — K439 Ventral hernia without obstruction or gangrene: Secondary | ICD-10-CM | POA: Diagnosis not present

## 2019-06-28 DIAGNOSIS — R39198 Other difficulties with micturition: Secondary | ICD-10-CM | POA: Diagnosis not present

## 2019-07-01 DIAGNOSIS — E1169 Type 2 diabetes mellitus with other specified complication: Secondary | ICD-10-CM | POA: Diagnosis not present

## 2019-07-01 DIAGNOSIS — I10 Essential (primary) hypertension: Secondary | ICD-10-CM | POA: Diagnosis not present

## 2019-07-05 ENCOUNTER — Other Ambulatory Visit: Payer: Self-pay

## 2019-07-05 NOTE — Patient Outreach (Signed)
[facility]) Care Management  07/05/2019  Terry Costa 12/21/1945 [id number]   Medication Adherence call to Mr. Terry Costa [place] Identifiers Verify spoke with patient he is past due on Rosuvastatin 10 mg patient explain he takes one tablet daily and has enough for another month and then he will order, patient is showing past due on Rosuvastatin 10 mg under [place].   [place] Management Direct Dial [phone]  Fax [phone] [email or web address]

## 2019-09-22 DIAGNOSIS — E1169 Type 2 diabetes mellitus with other specified complication: Secondary | ICD-10-CM | POA: Diagnosis not present

## 2019-09-22 DIAGNOSIS — R221 Localized swelling, mass and lump, neck: Secondary | ICD-10-CM | POA: Diagnosis not present

## 2019-09-22 DIAGNOSIS — E782 Mixed hyperlipidemia: Secondary | ICD-10-CM | POA: Diagnosis not present

## 2019-09-22 DIAGNOSIS — I10 Essential (primary) hypertension: Secondary | ICD-10-CM | POA: Diagnosis not present

## 2019-09-22 DIAGNOSIS — R0602 Shortness of breath: Secondary | ICD-10-CM | POA: Diagnosis not present

## 2019-10-14 DIAGNOSIS — K439 Ventral hernia without obstruction or gangrene: Secondary | ICD-10-CM | POA: Diagnosis not present

## 2019-10-18 ENCOUNTER — Other Ambulatory Visit: Payer: Self-pay

## 2019-10-18 NOTE — Patient Outreach (Signed)
[facility]) Care Management  10/18/2019  Terry Costa 05-02-1946 [id number]   Medication Adherence call to Terry Costa patients telephone number is disconnected. Terry Costa is showing past due on Rosuvastatin 10 mg under [place].   [place] Management Direct Dial [phone]  Fax [phone] [email or web address]

## 2019-12-16 DIAGNOSIS — K439 Ventral hernia without obstruction or gangrene: Secondary | ICD-10-CM | POA: Diagnosis not present

## 2019-12-16 DIAGNOSIS — L601 Onycholysis: Secondary | ICD-10-CM | POA: Diagnosis not present

## 2019-12-16 DIAGNOSIS — E1169 Type 2 diabetes mellitus with other specified complication: Secondary | ICD-10-CM | POA: Diagnosis not present

## 2019-12-16 DIAGNOSIS — N189 Chronic kidney disease, unspecified: Secondary | ICD-10-CM | POA: Diagnosis not present

## 2019-12-28 ENCOUNTER — Other Ambulatory Visit: Payer: Self-pay | Admitting: Surgery

## 2019-12-28 DIAGNOSIS — M6208 Separation of muscle (nontraumatic), other site: Secondary | ICD-10-CM | POA: Diagnosis not present

## 2020-03-15 DIAGNOSIS — I10 Essential (primary) hypertension: Secondary | ICD-10-CM | POA: Diagnosis not present

## 2020-03-15 DIAGNOSIS — M130 Polyarthritis, unspecified: Secondary | ICD-10-CM | POA: Diagnosis not present

## 2020-03-15 DIAGNOSIS — E1169 Type 2 diabetes mellitus with other specified complication: Secondary | ICD-10-CM | POA: Diagnosis not present

## 2020-03-15 DIAGNOSIS — N189 Chronic kidney disease, unspecified: Secondary | ICD-10-CM | POA: Diagnosis not present

## 2020-03-15 DIAGNOSIS — J301 Allergic rhinitis due to pollen: Secondary | ICD-10-CM | POA: Diagnosis not present

## 2020-04-02 ENCOUNTER — Other Ambulatory Visit: Payer: Self-pay

## 2020-04-02 ENCOUNTER — Ambulatory Visit: Payer: Medicare Other | Admitting: Podiatry

## 2020-04-02 DIAGNOSIS — G629 Polyneuropathy, unspecified: Secondary | ICD-10-CM | POA: Diagnosis not present

## 2020-04-02 MED ORDER — GABAPENTIN 100 MG PO CAPS: 100.0000 mg | ORAL_CAPSULE | Freq: Every day | ORAL | 1 refills | Status: DC

## 2020-04-03 NOTE — Progress Notes (Signed)
   HPI: 74 y.o. male presenting today as a new patient, referred by Dr. Criss Rosales, with a chief complaint of gradually worsening cramping and pressure of the bilateral lower extremities during the night that has been ongoing intermittently over the past year. She reports associated swelling of the BLE. She has been stretching the legs for treatment with little relief. There are no worsening factors noted. Patient is here for further evaluation and treatment.   No past medical history on file.   Physical Exam: General: The patient is alert and oriented x3 in no acute distress.  Dermatology: Skin is warm, dry and supple bilateral lower extremities. Negative for open lesions or macerations.  Vascular: Palpable pedal pulses bilaterally. No edema or erythema noted. Capillary refill within normal limits.  Neurological: Epicritic and protective threshold grossly intact bilaterally.   Musculoskeletal Exam: Range of motion within normal limits to all pedal and ankle joints bilateral. Muscle strength 5/5 in all groups bilateral.   Assessment: 1. Nocturnal peripheral neuropathy BLE   Plan of Care:  1. Patient evaluated.   2. Prescription for Gabapentin 100 mg QHS.  3. Recommended good shoe gear.  4. Return to clinic as needed.      Edrick Kins, DPM [facility]  Dr. Edrick Kins, DPM    [address], [place] [address]                Office [phone]  Fax [phone]

## 2020-04-09 DIAGNOSIS — I129 Hypertensive chronic kidney disease with stage 1 through stage 4 chronic kidney disease, or unspecified chronic kidney disease: Secondary | ICD-10-CM | POA: Diagnosis not present

## 2020-04-09 DIAGNOSIS — N1832 Chronic kidney disease, stage 3b: Secondary | ICD-10-CM | POA: Diagnosis not present

## 2020-04-09 DIAGNOSIS — E7849 Other hyperlipidemia: Secondary | ICD-10-CM | POA: Diagnosis not present

## 2020-04-09 DIAGNOSIS — E1122 Type 2 diabetes mellitus with diabetic chronic kidney disease: Secondary | ICD-10-CM | POA: Diagnosis not present

## 2020-05-21 DIAGNOSIS — I10 Essential (primary) hypertension: Secondary | ICD-10-CM | POA: Diagnosis not present

## 2020-05-21 DIAGNOSIS — E1169 Type 2 diabetes mellitus with other specified complication: Secondary | ICD-10-CM | POA: Diagnosis not present

## 2020-06-26 DIAGNOSIS — I10 Essential (primary) hypertension: Secondary | ICD-10-CM | POA: Diagnosis not present

## 2020-06-26 DIAGNOSIS — Z72820 Sleep deprivation: Secondary | ICD-10-CM | POA: Diagnosis not present

## 2020-08-17 DIAGNOSIS — Z72820 Sleep deprivation: Secondary | ICD-10-CM | POA: Diagnosis not present

## 2020-08-17 DIAGNOSIS — E1169 Type 2 diabetes mellitus with other specified complication: Secondary | ICD-10-CM | POA: Diagnosis not present

## 2020-08-17 DIAGNOSIS — E785 Hyperlipidemia, unspecified: Secondary | ICD-10-CM | POA: Diagnosis not present

## 2020-08-17 DIAGNOSIS — M130 Polyarthritis, unspecified: Secondary | ICD-10-CM | POA: Diagnosis not present

## 2020-08-25 DIAGNOSIS — G4733 Obstructive sleep apnea (adult) (pediatric): Secondary | ICD-10-CM | POA: Diagnosis not present

## 2020-08-26 DIAGNOSIS — G4733 Obstructive sleep apnea (adult) (pediatric): Secondary | ICD-10-CM | POA: Diagnosis not present

## 2021-01-08 DIAGNOSIS — E782 Mixed hyperlipidemia: Secondary | ICD-10-CM | POA: Diagnosis not present

## 2021-01-08 DIAGNOSIS — I10 Essential (primary) hypertension: Secondary | ICD-10-CM | POA: Diagnosis not present

## 2021-01-08 DIAGNOSIS — I129 Hypertensive chronic kidney disease with stage 1 through stage 4 chronic kidney disease, or unspecified chronic kidney disease: Secondary | ICD-10-CM | POA: Diagnosis not present

## 2021-01-08 DIAGNOSIS — G4733 Obstructive sleep apnea (adult) (pediatric): Secondary | ICD-10-CM | POA: Diagnosis not present

## 2021-01-08 DIAGNOSIS — E1140 Type 2 diabetes mellitus with diabetic neuropathy, unspecified: Secondary | ICD-10-CM | POA: Diagnosis not present

## 2021-03-11 DIAGNOSIS — R7309 Other abnormal glucose: Secondary | ICD-10-CM | POA: Diagnosis not present

## 2021-03-11 DIAGNOSIS — I10 Essential (primary) hypertension: Secondary | ICD-10-CM | POA: Diagnosis not present

## 2021-03-11 DIAGNOSIS — E782 Mixed hyperlipidemia: Secondary | ICD-10-CM | POA: Diagnosis not present

## 2021-03-11 DIAGNOSIS — Z0001 Encounter for general adult medical examination with abnormal findings: Secondary | ICD-10-CM | POA: Diagnosis not present

## 2021-03-11 DIAGNOSIS — M189 Osteoarthritis of first carpometacarpal joint, unspecified: Secondary | ICD-10-CM | POA: Diagnosis not present

## 2021-03-13 ENCOUNTER — Other Ambulatory Visit: Payer: Self-pay

## 2021-03-13 ENCOUNTER — Ambulatory Visit: Payer: Medicare Other | Admitting: Podiatry

## 2021-03-13 DIAGNOSIS — G629 Polyneuropathy, unspecified: Secondary | ICD-10-CM

## 2021-03-13 MED ORDER — CYCLOBENZAPRINE HCL 10 MG PO TABS: 10.0000 mg | ORAL_TABLET | Freq: Three times a day (TID) | ORAL | 1 refills | Status: DC | PRN

## 2021-03-13 MED ORDER — METHYLPREDNISOLONE 4 MG PO TBPK: ORAL_TABLET | ORAL | 0 refills | Status: DC

## 2021-03-18 ENCOUNTER — Encounter: Payer: Self-pay | Admitting: Neurology

## 2021-04-01 NOTE — Progress Notes (Signed)
   HPI: 75 y.o. male presenting today for follow-up evaluation of gradually worsening cramping and pressure of the bilateral lower extremities during the night that has been ongoing intermittently over the past year. She reports associated swelling of the BLE. She has been stretching the legs for treatment with little relief. There are no worsening factors noted. Patient is here for further evaluation and treatment.   No past medical history on file.   Physical Exam: General: The patient is alert and oriented x3 in no acute distress.  Dermatology: Skin is warm, dry and supple bilateral lower extremities. Negative for open lesions or macerations.  Vascular: Palpable pedal pulses bilaterally. No edema or erythema noted. Capillary refill within normal limits.  Neurological: Epicritic and protective threshold grossly intact bilaterally.  Positive Tinel's sign with percussion of the tibial nerve bilateral  Musculoskeletal Exam: Range of motion within normal limits to all pedal and ankle joints bilateral. Muscle strength 5/5 in all groups bilateral.   Assessment: 1. Nocturnal peripheral neuropathy BLE 2.  Tarsal tunnel bilateral lower extremities   Plan of Care:  1. Patient evaluated.  Explained to the patient that this is mostly a nerve etiology.  May be best suited for a neurologist to fully evaluate the patient 2.  Continue gabapentin 100 mg nightly 3.  Today we will refer the patient to neurology 4.  Prescription for Medrol Dosepak 5.  Prescription for Flexeril 6.  Return to clinic as needed   Edrick Kins, DPM [facility]  Dr. Edrick Kins, DPM    [address], [place] [address]                Office [phone]  Fax [phone]

## 2021-04-03 NOTE — Progress Notes (Signed)
[facility] Clinic Note - Initial Visit   Date: 04/04/21  Terry Costa MRN: [id number] DOB: 08-01-1946   Dear Dr. Amalia Hailey:  Thank you for your kind referral of Terry Costa for consultation of bilateral feet pain. Although his history is well known to you, please allow [place] to reiterate it for the purpose of our medical record. The patient was accompanied to the clinic by self.    History of Present Illness: Terry Costa is a 75 y.o. left-handed male with hypertension, hyperlipidemia, and history of prostate cancer s/p RT (2015) presenting for evaluation of bilateral feet pain.   He complains of numbness and swollen sensation over the soles of the feet and toes.  He also has achy pain in the feet.  It is worse at night time.  He tried gabapentin 100mg  and does not recall that it helped. He denies imbalance, weakness, or falls.   He works in Theatre manager, part-time.  He retired as a Technical brewer.   Past medical history: Hypertension Prostate cancer  \ Medications:  Outpatient Encounter Medications as of 04/04/2021  Medication Sig  . hydrochlorothiazide (HYDRODIURIL) 25 MG tablet Take by mouth.  . losartan (COZAAR) 100 MG tablet   . metoprolol tartrate (LOPRESSOR) 50 MG tablet   . potassium chloride SA (KLOR-CON) 20 MEQ tablet   . rosuvastatin (CRESTOR) 10 MG tablet   . [DISCONTINUED] cyclobenzaprine (FLEXERIL) 10 MG tablet Take 1 tablet (10 mg total) by mouth 3 (three) times daily as needed for muscle spasms. (Patient not taking: Reported on 04/04/2021)  . [DISCONTINUED] diclofenac Sodium (VOLTAREN) 1 % GEL   . [DISCONTINUED] fluticasone (FLONASE) 50 MCG/ACT nasal spray Place into the nose.  . [DISCONTINUED] gabapentin (NEURONTIN) 100 MG capsule Take 1 capsule (100 mg total) by mouth at bedtime.  . [DISCONTINUED] hydrogen peroxide 3 % external solution Apply topically.  . [DISCONTINUED] methylPREDNISolone (MEDROL DOSEPAK) 4 MG TBPK  tablet 6 day dose pack - take as directed  . [DISCONTINUED] montelukast (SINGULAIR) 10 MG tablet   . [DISCONTINUED] sodium chloride (OCEAN) 0.65 % nasal spray Place into the nose.   No facility-administered encounter medications on file as of 04/04/2021.    Allergies: No Known Allergies  Family History: Family History  Problem Relation Age of Onset  . Hypertension Mother     Social History: Social History   Tobacco Use  . Smoking status: Never Smoker  . Smokeless tobacco: Never Used  Substance Use Topics  . Alcohol use: Never  . Drug use: Never   Social History   Social History Narrative   Living with alone   1 cup coffee or green tea occasion only   Left handed    Vital Signs:  BP (!) 143/87   Pulse 72   Ht 5\' 8"  (1.727 m)   Wt 191 lb (86.6 kg)   SpO2 99%   BMI 29.04 kg/m   Neurological Exam: MENTAL STATUS including orientation to time, place, person, recent and remote memory, attention span and concentration, language, and fund of knowledge is normal.  Speech is not dysarthric.  CRANIAL NERVES: II:  No visual field defects.  III-IV-VI: Pupils equal round and reactive to light.  Normal conjugate, extra-ocular eye movements in all directions of gaze.  No nystagmus.  No ptosis.   V:  Normal facial sensation.    VII:  Normal facial symmetry and movements.   VIII:  Normal hearing and vestibular function.   IX-X:  Normal palatal movement.  XI:  Normal shoulder shrug and head rotation.   XII:  Normal tongue strength and range of motion, no deviation or fasciculation.  MOTOR:  No atrophy, fasciculations or abnormal movements.  No pronator drift.   Upper Extremity:  Right  Left  Deltoid  5/5   5/5   Biceps  5/5   5/5   Triceps  5/5   5/5   Infraspinatus 5/5  5/5  Medial pectoralis 5/5  5/5  Wrist extensors  5/5   5/5   Wrist flexors  5/5   5/5   Finger extensors  5/5   5/5   Finger flexors  5/5   5/5   Dorsal interossei  5/5   5/5   Abductor pollicis  5/5    5/5   Tone (Ashworth scale)  0  0   Lower Extremity:  Right  Left  Hip flexors  5/5   5/5   Hip extensors  5/5   5/5   Adductor 5/5  5/5  Abductor 5/5  5/5  Knee flexors  5/5   5/5   Knee extensors  5/5   5/5   Dorsiflexors  5/5   5/5   Plantarflexors  5/5   5/5   Toe extensors  5/5   5/5   Toe flexors  5/5   5/5   Tone (Ashworth scale)  0  0   MSRs:  Right        Left                  brachioradialis 2+  2+  biceps 2+  2+  triceps 2+  2+  patellar 2+  2+  ankle jerk 1+  1+  Hoffman no  no  plantar response down  down   SENSORY:  Vibration is reduced below the knees, temperature is also diminished at the feet.  Pin prick is intact.  Rhomberg sign is absent.  COORDINATION/GAIT: Normal finger-to- nose-finger.  Intact rapid alternating movements bilaterally.  Able to rise from a chair without using arms.  Gait narrow based and stable. Tandem and stressed gait intact.    IMPRESSION: Bilateral feet paresthesias, most suggestive of neuropathy  - NCS/EMG of the legs to characterize the nature of his symptoms  - If EDX confirms neuropathy, additional labs will be checked  - For pain, start gabapentin 300mg  at bedtime.  Titrate as needed  Further recommendations pending results.   Thank you for allowing me to participate in patient's care.  If I can answer any additional questions, I would be pleased to do so.    Sincerely,    Rosangelica Pevehouse K. Posey Pronto, DO

## 2021-04-04 ENCOUNTER — Encounter: Payer: Self-pay | Admitting: Neurology

## 2021-04-04 ENCOUNTER — Ambulatory Visit: Payer: Medicare Other | Admitting: Neurology

## 2021-04-04 ENCOUNTER — Other Ambulatory Visit: Payer: Self-pay

## 2021-04-04 VITALS — BP 143/87 | HR 72 | Ht 68.0 in | Wt 191.0 lb

## 2021-04-04 DIAGNOSIS — R200 Anesthesia of skin: Secondary | ICD-10-CM | POA: Diagnosis not present

## 2021-04-04 DIAGNOSIS — R202 Paresthesia of skin: Secondary | ICD-10-CM | POA: Diagnosis not present

## 2021-04-04 MED ORDER — GABAPENTIN 300 MG PO CAPS: 300.0000 mg | ORAL_CAPSULE | Freq: Every day | ORAL | 3 refills | Status: DC

## 2021-04-04 NOTE — Patient Instructions (Addendum)
Nerve testing of the legs.  Do not apply oil, lotion, or moisturizer on the feet.   Start gabapentin 300mg  at bedtime  ELECTROMYOGRAM AND NERVE CONDUCTION STUDIES (EMG/NCS) INSTRUCTIONS  How to Prepare The neurologist conducting the EMG will need to know if you have certain medical conditions. Tell the neurologist and other EMG lab personnel if you: . Have a pacemaker or any other electrical medical device . Take blood-thinning medications . Have hemophilia, a blood-clotting disorder that causes prolonged bleeding Bathing Take a shower or bath shortly before your exam in order to remove oils from your skin. Don't apply lotions or creams before the exam.  What to Expect You'll likely be asked to change into a hospital gown for the procedure and lie down on an examination table. The following explanations can help you understand what will happen during the exam.  . Electrodes. The neurologist or a technician places surface electrodes at various locations on your skin depending on where you're experiencing symptoms. Or the neurologist may insert needle electrodes at different sites depending on your symptoms.  . Sensations. The electrodes will at times transmit a tiny electrical current that you may feel as a twinge or spasm. The needle electrode may cause discomfort or pain that usually ends shortly after the needle is removed. If you are concerned about discomfort or pain, you may want to talk to the neurologist about taking a short break during the exam.  . Instructions. During the needle EMG, the neurologist will assess whether there is any spontaneous electrical activity when the muscle is at rest - activity that isn't present in healthy muscle tissue - and the degree of activity when you slightly contract the muscle.  He or she will give you instructions on resting and contracting a muscle at appropriate times. Depending on what muscles and nerves the neurologist is examining, he or she may ask  you to change positions during the exam.  After your EMG You may experience some temporary, minor bruising where the needle electrode was inserted into your muscle. This bruising should fade within several days. If it persists, contact your primary care doctor.

## 2021-04-10 ENCOUNTER — Other Ambulatory Visit: Payer: Self-pay

## 2021-04-10 ENCOUNTER — Other Ambulatory Visit (INDEPENDENT_AMBULATORY_CARE_PROVIDER_SITE_OTHER): Payer: Medicare Other

## 2021-04-10 ENCOUNTER — Ambulatory Visit: Payer: Medicare Other | Admitting: Neurology

## 2021-04-10 DIAGNOSIS — R202 Paresthesia of skin: Secondary | ICD-10-CM | POA: Diagnosis not present

## 2021-04-10 DIAGNOSIS — R200 Anesthesia of skin: Secondary | ICD-10-CM

## 2021-04-10 LAB — SEDIMENTATION RATE: Sed Rate: 12 mm/hr (ref 0–20)

## 2021-04-10 LAB — TSH: TSH: 1.44 u[IU]/mL (ref 0.35–4.50)

## 2021-04-10 LAB — FOLATE: Folate: 7.6 ng/mL (ref >5.9)

## 2021-04-10 LAB — VITAMIN B12: Vitamin B-12: 363 pg/mL (ref 211–911)

## 2021-04-10 NOTE — Progress Notes (Signed)
    Follow-up Visit   Date: 04/10/21   NAZAIRE CORDIAL MRN: [id number] DOB: 1946-10-30   Interim History: Terry Costa is a 75 y.o. left-handed male with prostate cancer, hypertension, and hyperlipidemia returning to the clinic for follow-up of bilateral feet pain.  The patient was accompanied to the clinic by self.  History of present illness: He complains of numbness and swollen sensation over the soles of the feet and toes.  He also has achy pain in the feet.  It is worse at night time.  He tried gabapentin 178m and does not recall that it helped. He denies imbalance, weakness, or falls.   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  He feet symptoms are unchanged.  There was delaying in picking up his gabapentin, so he started the gabapentin 3037mlast night and has not noticed any change.    Medications:  Current Outpatient Medications on File Prior to Visit  Medication Sig Dispense Refill  . gabapentin (NEURONTIN) 300 MG capsule Take 1 capsule (300 mg total) by mouth at bedtime. 30 capsule 3  . hydrochlorothiazide (HYDRODIURIL) 25 MG tablet Take by mouth.    . losartan (COZAAR) 100 MG tablet     . metoprolol tartrate (LOPRESSOR) 50 MG tablet     . potassium chloride SA (KLOR-CON) 20 MEQ tablet     . rosuvastatin (CRESTOR) 10 MG tablet      No current facility-administered medications on file prior to visit.    Allergies: No Known Allergies  Vital Signs:  There were no vitals taken for this visit.   Exam deferred  Data: NCS/EMG of the legs 04/10/2021:  Normal  IMPRESSION/PLAN: Bilateral feet paresthesias, clinically suggestive of neuropathy. Results of NCS/EMG discussed with patient which is normal.  He denies radicular pain and his EMG was normal, making radiculopathy unlikely.  I still have high clinical suspicion for neuropathy, small fiber neuropathy is possible  - Check ESR, vitamin  B12, folate, SPEP with IFE, TSH  - Consider skin biopsy, if symptoms change  - Continue gabapentin 30080mt bedtime  Further recommendations pending results.   Thank you for allowing me to participate in patient's care.  If I can answer any additional questions, I would be pleased to do so.    Sincerely,    Tiyon Sanor K. PatPosey ProntoO

## 2021-04-10 NOTE — Procedures (Signed)
[facility]  [place], [place]  [place], [place] [address] Tel: [phone] Fax:  [phone] Test Date:  04/10/2021  Patient: Terry Costa DOB: Nov 13, 1945 Physician: Narda Amber, DO  Sex: Male Height: 5\' 8"  Ref Phys: Narda Amber, DO  ID#: [id number]   Technician:    Patient Complaints: This is a 75 year old man referred for evaluation of bilateral feet numbness.  NCV & EMG Findings: Electrodiagnostic testing of the right lower extremity and additional studies of the left shows: 1. Bilateral sural and superficial peroneal sensory responses are within normal limits. 2. Bilateral peroneal and tibial motor responses are within normal limits. 3. Bilateral tibial H reflex studies are within normal limits. 4. There is no evidence of active or chronic motor axonal changes affecting any of the tested muscles.  Motor unit configuration and recruitment pattern is within normal limits.  Impression: This is a normal study of the lower extremities.  In particular, there is no evidence of a sensorimotor polyneuropathy or lumbosacral radiculopathy.   ___________________________ Narda Amber, DO    Nerve Conduction Studies Anti Sensory Summary Table   Stim Site NR Peak (ms) Norm Peak (ms) P-T Amp (V) Norm P-T Amp  Left Sup Peroneal Anti Sensory (Ant Lat Mall)  32C  12 cm    2.8 <4.6 6.4 >3  Right Sup Peroneal Anti Sensory (Ant Lat Mall)  32C  12 cm    2.9 <4.6 5.0 >3  Left Sural Anti Sensory (Lat Mall)  32C  Calf    2.7 <4.6 10.2 >3  Right Sural Anti Sensory (Lat Mall)  32C  Calf    3.1 <4.6 8.7 >3   Motor Summary Table   Stim Site NR Onset (ms) Norm Onset (ms) O-P Amp (mV) Norm O-P Amp Site1 Site2 Delta-0 (ms) Dist (cm) Vel (m/s) Norm Vel (m/s)  Left Peroneal Motor (Ext Dig Brev)  32C  Ankle    3.5 <6.0 4.0 >2.5 B Fib Ankle 8.7 38.0 44 >40  B Fib    12.2  4.0  Poplt B Fib 1.6 8.0 50 >40  Poplt    13.8  3.8         Right Peroneal Motor (Ext Dig Brev)   32C  Ankle    4.4 <6.0 3.4 >2.5 B Fib Ankle 7.9 39.0 49 >40  B Fib    12.3  3.4  Poplt B Fib 1.8 8.0 44 >40  Poplt    14.1  3.3         Left Tibial Motor (Abd Hall Brev)  32C  Ankle    3.8 <6.0 6.7 >4 Knee Ankle 10.3 45.0 44 >40  Knee    14.1  3.4         Right Tibial Motor (Abd Hall Brev)  32C  Ankle    5.9 <6.0 6.0 >4 Knee Ankle 9.2 44.0 48 >40  Knee    15.1  4.6          H Reflex Studies   NR H-Lat (ms) Lat Norm (ms) L-R H-Lat (ms)  Left Tibial (Gastroc)  32C     34.69 <35 0.41  Right Tibial (Gastroc)  32C     34.29 <35 0.41   EMG   Side Muscle Ins Act Fibs Psw Fasc Number Recrt Dur Dur. Amp Amp. Poly Poly. Comment  Right AntTibialis Nml Nml Nml Nml Nml Nml Nml Nml Nml Nml Nml Nml N/A  Right BicepsFemS Nml Nml Nml Nml Nml Nml Nml Nml Nml  Nml Nml Nml N/A  Right Gastroc Nml Nml Nml Nml Nml Nml Nml Nml Nml Nml Nml Nml N/A  Right Flex Dig Long Nml Nml Nml Nml Nml Nml Nml Nml Nml Nml Nml Nml N/A  Right RectFemoris Nml Nml Nml Nml Nml Nml Nml Nml Nml Nml Nml Nml N/A  Left BicepsFemS Nml Nml Nml Nml Nml Nml Nml Nml Nml Nml Nml Nml N/A  Left AntTibialis Nml Nml Nml Nml Nml Nml Nml Nml Nml Nml Nml Nml N/A  Left Gastroc Nml Nml Nml Nml Nml Nml Nml Nml Nml Nml Nml Nml N/A  Left Flex Dig Long Nml Nml Nml Nml Nml Nml Nml Nml Nml Nml Nml Nml N/A  Left RectFemoris Nml Nml Nml Nml Nml Nml Nml Nml Nml Nml Nml Nml N/A      Waveforms:

## 2021-04-12 LAB — IMMUNOFIXATION ELECTROPHORESIS
IgG (Immunoglobin G), Serum: 1124 mg/dL (ref 600–1540)
IgM, Serum: 256 mg/dL (ref 50–300)
Immunoglobulin A: 159 mg/dL (ref 70–320)

## 2021-04-12 LAB — PROTEIN ELECTROPHORESIS, SERUM
Abnormal Protein Band1: 0.2 g/dL — ABNORMAL HIGH
Albumin ELP: 4 g/dL (ref 3.8–4.8)
Alpha 1: 0.3 g/dL (ref 0.2–0.3)
Alpha 2: 0.6 g/dL (ref 0.5–0.9)
Beta 2: 0.3 g/dL (ref 0.2–0.5)
Beta Globulin: 0.4 g/dL (ref 0.4–0.6)
Gamma Globulin: 1.1 g/dL (ref 0.8–1.7)
Total Protein: 6.8 g/dL (ref 6.1–8.1)

## 2021-04-25 ENCOUNTER — Telehealth: Payer: Self-pay | Admitting: Neurology

## 2021-04-25 ENCOUNTER — Telehealth: Payer: Self-pay

## 2021-04-25 NOTE — Telephone Encounter (Signed)
See other telephone note. Patient was informed of normal labs and was transferred up front to schedule his 3 months f/u appt with Dr. Posey Pronto.

## 2021-04-25 NOTE — Telephone Encounter (Signed)
-----   Message from Elta Guadeloupe, [place] sent at 04/23/2021  9:26 AM EDT -----  ----- Message ----- From: Alda Berthold, DO Sent: 04/17/2021   1:18 PM EDT To: Elta Guadeloupe, CMA  Please notify patient lab are within normal limits.  I would like him to follow-up in 3 months and see how his symptoms are doing. Thank you.

## 2021-04-25 NOTE — Telephone Encounter (Signed)
Called and spoke to patient and informed him of normal labs and that Dr. Posey Pronto would like to see him back in 3 months. Patient was transferred up front to have his appt scheduled.

## 2021-04-25 NOTE — Telephone Encounter (Signed)
Called patient and left a message for a call back in regards to his results.

## 2021-04-25 NOTE — Telephone Encounter (Signed)
Patient returned Mahina's call. See closed telephone note.

## 2021-05-09 DIAGNOSIS — E1122 Type 2 diabetes mellitus with diabetic chronic kidney disease: Secondary | ICD-10-CM | POA: Diagnosis not present

## 2021-05-09 DIAGNOSIS — I129 Hypertensive chronic kidney disease with stage 1 through stage 4 chronic kidney disease, or unspecified chronic kidney disease: Secondary | ICD-10-CM | POA: Diagnosis not present

## 2021-05-09 DIAGNOSIS — E7849 Other hyperlipidemia: Secondary | ICD-10-CM | POA: Diagnosis not present

## 2021-05-09 DIAGNOSIS — N1832 Chronic kidney disease, stage 3b: Secondary | ICD-10-CM | POA: Diagnosis not present

## 2021-06-26 ENCOUNTER — Other Ambulatory Visit: Payer: Self-pay

## 2021-06-26 ENCOUNTER — Encounter: Payer: Self-pay | Admitting: Neurology

## 2021-06-26 ENCOUNTER — Ambulatory Visit: Payer: Medicare Other | Admitting: Neurology

## 2021-06-26 VITALS — BP 128/80 | HR 62 | Ht 68.0 in | Wt 188.0 lb

## 2021-06-26 DIAGNOSIS — R200 Anesthesia of skin: Secondary | ICD-10-CM | POA: Diagnosis not present

## 2021-06-26 DIAGNOSIS — R202 Paresthesia of skin: Secondary | ICD-10-CM | POA: Diagnosis not present

## 2021-06-26 MED ORDER — GABAPENTIN 300 MG PO CAPS: 300.0000 mg | ORAL_CAPSULE | Freq: Every day | ORAL | 1 refills | Status: DC

## 2021-06-26 NOTE — Progress Notes (Signed)
Follow-up Visit   Date: 06/26/21   Terry Costa MRN: [id number] DOB: 1946/04/15   Interim History: Terry Costa is a 75 y.o. left-handed male with prostate cancer, hypertension, and hyperlipidemia returning to the clinic for follow-up of bilateral feet pain.  The patient was accompanied to the clinic by self.  History of present illness: He complains of numbness and swollen sensation over the soles of the feet and toes.  He also has achy pain in the feet.  It is worse at night time.  He tried gabapentin 182m and does not recall that it helped. He denies imbalance, weakness, or falls.    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  He feet symptoms are unchanged.  There was delaying in picking up his gabapentin, so he started the gabapentin 3025mlast night and has not noticed any change.    UPDATE 06/26/2021:  He is here for follow-up visit.  He started using gabapentin 30041mhich helps him sleep better. He has intermittent spells of numbness and pain during the day, especially in the toes.   There is no specific pattern. Balance is good.  No weakness.  Medications:  Current Outpatient Medications on File Prior to Visit  Medication Sig Dispense Refill   gabapentin (NEURONTIN) 300 MG capsule Take 1 capsule (300 mg total) by mouth at bedtime. 30 capsule 3   hydrochlorothiazide (HYDRODIURIL) 25 MG tablet Take by mouth.     losartan (COZAAR) 100 MG tablet      metoprolol tartrate (LOPRESSOR) 50 MG tablet      potassium chloride SA (KLOR-CON) 20 MEQ tablet      rosuvastatin (CRESTOR) 10 MG tablet      No current facility-administered medications on file prior to visit.    Allergies: No Known Allergies  Vital Signs:  BP 128/80   Pulse 62   Ht 5' 8"  (1.727 m)   Wt 188 lb (85.3 kg)   SpO2 99%   BMI 28.59 kg/m    Neurological Exam: MENTAL STATUS including orientation to time, place, person,  recent and remote memory, attention span and concentration, language, and fund of knowledge is normal.  Speech is not dysarthric.  CRANIAL NERVES: No visual field defects. Pupils equal round and reactive to light.  Normal conjugate, extra-ocular eye movements in all directions of gaze.  No ptosis.   MOTOR:  Motor strength is 5/5 in all extremities.  No atrophy, fasciculations or abnormal movements.  No pronator drift.  Tone is normal.    MSRs:  Reflexes are 2+/4 throughout, including ankles.  SENSORY:  Intact to vibration and temperature throughout.  COORDINATION/GAIT:  Normal finger-to- nose-finger and heel-to-shin.  Intact rapid alternating movements bilaterally.  Gait narrow based and stable.    Data: NCS/EMG of the legs 04/10/2021:  Normal  Labs 04/10/2021:  ESR 12, SPEP with IFE faint IgG kappa band, TSH 1.4, folate 7.6, vitamin B12 363  IMPRESSION/PLAN: Bilateral feet paresthesias, clinically suggestive of neuropathy. NCS/EMG of the legs was normal.  He denies radicular pain and his EMG was normal, making radiculopathy unlikely.  I still have high clinical suspicion for neuropathy, small fiber neuropathy is possible.   - Continue gabapentin 300m56m bedtime + ok to take during the day as needed - His neuropathy labs showed a faint IgG kappa band, I will recheck this in 6 months.   - Consider skin biopsy, if symptoms  change  Return to clinic in 6 months, or sooner as needed  Total time spent reviewing records, interview, history/exam, documentation, and coordination of care on day of encounter:  25 min   Thank you for allowing me to participate in patient's care.  If I can answer any additional questions, I would be pleased to do so.    Sincerely,    Cierah Crader K. Posey Pronto, DO

## 2021-06-26 NOTE — Patient Instructions (Signed)
Continue gabapentin '300mg'$  at bedtime. OK to take an extra dose as needed  Return to clinic 6 months

## 2021-07-08 DIAGNOSIS — M189 Osteoarthritis of first carpometacarpal joint, unspecified: Secondary | ICD-10-CM | POA: Diagnosis not present

## 2021-07-08 DIAGNOSIS — I10 Essential (primary) hypertension: Secondary | ICD-10-CM | POA: Diagnosis not present

## 2021-07-10 DIAGNOSIS — I129 Hypertensive chronic kidney disease with stage 1 through stage 4 chronic kidney disease, or unspecified chronic kidney disease: Secondary | ICD-10-CM | POA: Diagnosis not present

## 2021-07-10 DIAGNOSIS — N1832 Chronic kidney disease, stage 3b: Secondary | ICD-10-CM | POA: Diagnosis not present

## 2021-07-10 DIAGNOSIS — E1122 Type 2 diabetes mellitus with diabetic chronic kidney disease: Secondary | ICD-10-CM | POA: Diagnosis not present

## 2021-07-10 DIAGNOSIS — E7849 Other hyperlipidemia: Secondary | ICD-10-CM | POA: Diagnosis not present

## 2021-07-11 ENCOUNTER — Ambulatory Visit: Payer: Medicare Other | Admitting: Neurology

## 2021-07-16 DIAGNOSIS — I129 Hypertensive chronic kidney disease with stage 1 through stage 4 chronic kidney disease, or unspecified chronic kidney disease: Secondary | ICD-10-CM | POA: Diagnosis not present

## 2021-07-16 DIAGNOSIS — J208 Acute bronchitis due to other specified organisms: Secondary | ICD-10-CM | POA: Diagnosis not present

## 2021-07-16 DIAGNOSIS — H6123 Impacted cerumen, bilateral: Secondary | ICD-10-CM | POA: Diagnosis not present

## 2021-07-16 DIAGNOSIS — E1169 Type 2 diabetes mellitus with other specified complication: Secondary | ICD-10-CM | POA: Diagnosis not present

## 2021-10-21 DIAGNOSIS — R197 Diarrhea, unspecified: Secondary | ICD-10-CM | POA: Diagnosis not present

## 2021-10-21 DIAGNOSIS — Z86010 Personal history of colonic polyps: Secondary | ICD-10-CM | POA: Diagnosis not present

## 2021-10-21 DIAGNOSIS — I10 Essential (primary) hypertension: Secondary | ICD-10-CM | POA: Diagnosis not present

## 2021-11-12 DIAGNOSIS — K635 Polyp of colon: Secondary | ICD-10-CM | POA: Diagnosis not present

## 2021-11-12 DIAGNOSIS — Z86010 Personal history of colonic polyps: Secondary | ICD-10-CM | POA: Diagnosis not present

## 2021-11-12 DIAGNOSIS — D123 Benign neoplasm of transverse colon: Secondary | ICD-10-CM | POA: Diagnosis not present

## 2021-11-15 ENCOUNTER — Other Ambulatory Visit: Payer: Self-pay | Admitting: Family Medicine

## 2021-11-15 ENCOUNTER — Ambulatory Visit
Admission: RE | Admit: 2021-11-15 | Discharge: 2021-11-15 | Disposition: A | Discharge status: Home / Self Care | Payer: Medicare Other | Source: Ambulatory Visit | Attending: Family Medicine | Admitting: Family Medicine

## 2021-11-15 DIAGNOSIS — M1611 Unilateral primary osteoarthritis, right hip: Secondary | ICD-10-CM | POA: Diagnosis not present

## 2021-11-15 DIAGNOSIS — G4733 Obstructive sleep apnea (adult) (pediatric): Secondary | ICD-10-CM | POA: Diagnosis not present

## 2021-11-15 DIAGNOSIS — M25551 Pain in right hip: Secondary | ICD-10-CM

## 2021-11-15 DIAGNOSIS — I10 Essential (primary) hypertension: Secondary | ICD-10-CM | POA: Diagnosis not present

## 2021-11-15 DIAGNOSIS — I129 Hypertensive chronic kidney disease with stage 1 through stage 4 chronic kidney disease, or unspecified chronic kidney disease: Secondary | ICD-10-CM | POA: Diagnosis not present

## 2021-11-15 DIAGNOSIS — E118 Type 2 diabetes mellitus with unspecified complications: Secondary | ICD-10-CM | POA: Diagnosis not present

## 2021-11-15 DIAGNOSIS — E1169 Type 2 diabetes mellitus with other specified complication: Secondary | ICD-10-CM | POA: Diagnosis not present

## 2021-11-15 DIAGNOSIS — E785 Hyperlipidemia, unspecified: Secondary | ICD-10-CM | POA: Diagnosis not present

## 2021-12-02 DIAGNOSIS — E782 Mixed hyperlipidemia: Secondary | ICD-10-CM | POA: Diagnosis not present

## 2021-12-02 DIAGNOSIS — M130 Polyarthritis, unspecified: Secondary | ICD-10-CM | POA: Diagnosis not present

## 2021-12-02 DIAGNOSIS — G5793 Unspecified mononeuropathy of bilateral lower limbs: Secondary | ICD-10-CM | POA: Diagnosis not present

## 2021-12-02 DIAGNOSIS — I129 Hypertensive chronic kidney disease with stage 1 through stage 4 chronic kidney disease, or unspecified chronic kidney disease: Secondary | ICD-10-CM | POA: Diagnosis not present

## 2021-12-23 DIAGNOSIS — I10 Essential (primary) hypertension: Secondary | ICD-10-CM | POA: Diagnosis not present

## 2021-12-23 DIAGNOSIS — E782 Mixed hyperlipidemia: Secondary | ICD-10-CM | POA: Diagnosis not present

## 2021-12-23 DIAGNOSIS — I2510 Atherosclerotic heart disease of native coronary artery without angina pectoris: Secondary | ICD-10-CM | POA: Diagnosis not present

## 2021-12-23 DIAGNOSIS — N183 Chronic kidney disease, stage 3 unspecified: Secondary | ICD-10-CM | POA: Diagnosis not present

## 2021-12-23 DIAGNOSIS — Z79899 Other long term (current) drug therapy: Secondary | ICD-10-CM | POA: Diagnosis not present

## 2021-12-23 DIAGNOSIS — N99111 Postprocedural bulbous urethral stricture: Secondary | ICD-10-CM | POA: Diagnosis not present

## 2021-12-27 ENCOUNTER — Encounter: Payer: Self-pay | Admitting: Neurology

## 2021-12-27 ENCOUNTER — Ambulatory Visit: Payer: Medicare Other | Admitting: Neurology

## 2021-12-27 ENCOUNTER — Other Ambulatory Visit (INDEPENDENT_AMBULATORY_CARE_PROVIDER_SITE_OTHER): Payer: Medicare Other

## 2021-12-27 ENCOUNTER — Other Ambulatory Visit: Payer: Self-pay

## 2021-12-27 VITALS — BP 131/85 | HR 75 | Ht 68.0 in | Wt 195.0 lb

## 2021-12-27 DIAGNOSIS — R202 Paresthesia of skin: Secondary | ICD-10-CM

## 2021-12-27 DIAGNOSIS — R200 Anesthesia of skin: Secondary | ICD-10-CM | POA: Diagnosis not present

## 2021-12-27 MED ORDER — GABAPENTIN 300 MG PO CAPS: 300.0000 mg | ORAL_CAPSULE | Freq: Every day | ORAL | 3 refills | Status: DC

## 2021-12-27 NOTE — Progress Notes (Signed)
Follow-up Visit   Date: 12/27/21   Terry Costa MRN: [id number] DOB: [birth year]   Interim History: Terry Costa is a 76 y.o. left-handed male with prostate cancer, hypertension, and hyperlipidemia returning to the clinic for follow-up of bilateral feet pain.  The patient was accompanied to the clinic by self.  History of present illness: He complains of numbness and swollen sensation over the soles of the feet and toes.  He also has achy pain in the feet.  It is worse at night time.  He tried gabapentin 175m and does not recall that it helped. He denies imbalance, weakness, or falls.    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  He feet symptoms are unchanged.  There was delaying in picking up his gabapentin, so he started the gabapentin 3048mlast night and has not noticed any change.    UPDATE 06/26/2021:  He is here for follow-up visit.  He started using gabapentin 30062mhich helps him sleep better. He has intermittent spells of numbness and pain during the day, especially in the toes.   There is no specific pattern. Balance is good.  No weakness.  UPDATE 12/27/2021:  There has not been any significant change in his feet discomfort which remains worse at bedtime.  He has sharp pain involving the tips of the toes and numbness in the feet.  He denies falls or weakness.  He takes gabapentin 300m34m bedtime and feels that it makes him more sleepy, than actually help with the pain.    Medications:  Current Outpatient Medications on File Prior to Visit  Medication Sig Dispense Refill   gabapentin (NEURONTIN) 300 MG capsule Take 1 capsule (300 mg total) by mouth at bedtime. 90 capsule 1   hydrochlorothiazide (HYDRODIURIL) 25 MG tablet Take by mouth.     losartan (COZAAR) 100 MG tablet      metoprolol tartrate (LOPRESSOR) 50 MG tablet      potassium chloride SA (KLOR-CON) 20 MEQ tablet      rosuvastatin  (CRESTOR) 10 MG tablet      No current facility-administered medications on file prior to visit.    Allergies: No Known Allergies  Vital Signs:  BP 131/85    Pulse 75    Ht 5' 8"  (1.727 m)    Wt 195 lb (88.5 kg)    SpO2 99%    BMI 29.65 kg/m    Neurological Exam: MENTAL STATUS including orientation to time, place, person, recent and remote memory, attention span and concentration, language, and fund of knowledge is normal.  Speech is not dysarthric.  CRANIAL NERVES: No visual field defects. Pupils equal round and reactive to light.  Normal conjugate, extra-ocular eye movements in all directions of gaze.  No ptosis.   MOTOR:  Motor strength is 5/5 in all extremities.  No atrophy, fasciculations or abnormal movements.  No pronator drift.  Tone is normal.    MSRs:  Reflexes are 2+/4 throughout, including ankles.  SENSORY:  Intact to vibration and temperature throughout.  COORDINATION/GAIT:  Normal finger-to- nose-finger and heel-to-shin.  Intact rapid alternating movements bilaterally.  Gait narrow based and stable.    Data: NCS/EMG of the legs 04/10/2021:  Normal  Labs 04/10/2021:  ESR 12, SPEP with IFE faint IgG kappa band, TSH 1.4, folate 7.6, vitamin B12 363  IMPRESSION/PLAN: Bilateral feet paresthesias, clinically suggestive of neuropathy.  NCS/EMG of the legs  was normal.  Small fiber neuropathy is possible - Continue gabapentin 346m at bedtime - OTC lidocaine cream to toes at bedtime as needed - His neuropathy labs showed a faint IgG kappa band, I will recheck SPEP with IFE today.  - Consider skin biopsy, if symptoms change  Return to clinic in 1 year   Thank you for allowing me to participate in patient's care.  If I can answer any additional questions, I would be pleased to do so.    Sincerely,    Chip Canepa K. PPosey Pronto DO

## 2021-12-27 NOTE — Patient Instructions (Addendum)
You can try lidocaine cream (Aspercream or Salonpas) on the tips of your toes for burning pain.  Continue gabapentin 300mg  at bedtime  Return to clinic in 1 year

## 2021-12-31 LAB — PROTEIN ELECTROPHORESIS, SERUM
Abnormal Protein Band1: 0.3 g/dL — ABNORMAL HIGH
Albumin ELP: 4.4 g/dL (ref 3.8–4.8)
Alpha 1: 0.3 g/dL (ref 0.2–0.3)
Alpha 2: 0.7 g/dL (ref 0.5–0.9)
Beta 2: 0.3 g/dL (ref 0.2–0.5)
Beta Globulin: 0.4 g/dL (ref 0.4–0.6)
Gamma Globulin: 1.3 g/dL (ref 0.8–1.7)
Total Protein: 7.4 g/dL (ref 6.1–8.1)

## 2021-12-31 LAB — IMMUNOFIXATION ELECTROPHORESIS
IgG (Immunoglobin G), Serum: 1233 mg/dL (ref 600–1540)
IgM, Serum: 254 mg/dL (ref 50–300)
Immunofix Electr Int: NOT DETECTED
Immunoglobulin A: 161 mg/dL (ref 70–320)

## 2022-01-07 DIAGNOSIS — Z01810 Encounter for preprocedural cardiovascular examination: Secondary | ICD-10-CM | POA: Diagnosis not present

## 2022-01-07 DIAGNOSIS — N99111 Postprocedural bulbous urethral stricture: Secondary | ICD-10-CM | POA: Diagnosis not present

## 2022-01-07 DIAGNOSIS — I498 Other specified cardiac arrhythmias: Secondary | ICD-10-CM | POA: Diagnosis not present

## 2022-01-07 DIAGNOSIS — Z01812 Encounter for preprocedural laboratory examination: Secondary | ICD-10-CM | POA: Diagnosis not present

## 2022-01-09 DIAGNOSIS — N183 Chronic kidney disease, stage 3 unspecified: Secondary | ICD-10-CM | POA: Diagnosis not present

## 2022-01-09 DIAGNOSIS — N35912 Unspecified bulbous urethral stricture, male: Secondary | ICD-10-CM | POA: Diagnosis not present

## 2022-01-09 DIAGNOSIS — Z79899 Other long term (current) drug therapy: Secondary | ICD-10-CM | POA: Diagnosis not present

## 2022-01-09 DIAGNOSIS — I2510 Atherosclerotic heart disease of native coronary artery without angina pectoris: Secondary | ICD-10-CM | POA: Diagnosis not present

## 2022-01-09 DIAGNOSIS — Z923 Personal history of irradiation: Secondary | ICD-10-CM | POA: Diagnosis not present

## 2022-01-09 DIAGNOSIS — I129 Hypertensive chronic kidney disease with stage 1 through stage 4 chronic kidney disease, or unspecified chronic kidney disease: Secondary | ICD-10-CM | POA: Diagnosis not present

## 2022-01-09 DIAGNOSIS — N99111 Postprocedural bulbous urethral stricture: Secondary | ICD-10-CM | POA: Diagnosis not present

## 2022-02-17 DIAGNOSIS — N99111 Postprocedural bulbous urethral stricture: Secondary | ICD-10-CM | POA: Diagnosis not present

## 2022-03-14 DIAGNOSIS — R0601 Orthopnea: Secondary | ICD-10-CM | POA: Diagnosis not present

## 2022-03-14 DIAGNOSIS — I129 Hypertensive chronic kidney disease with stage 1 through stage 4 chronic kidney disease, or unspecified chronic kidney disease: Secondary | ICD-10-CM | POA: Diagnosis not present

## 2022-03-14 DIAGNOSIS — Z0000 Encounter for general adult medical examination without abnormal findings: Secondary | ICD-10-CM | POA: Diagnosis not present

## 2022-03-14 DIAGNOSIS — G4733 Obstructive sleep apnea (adult) (pediatric): Secondary | ICD-10-CM | POA: Diagnosis not present

## 2022-03-14 DIAGNOSIS — E559 Vitamin D deficiency, unspecified: Secondary | ICD-10-CM | POA: Diagnosis not present

## 2022-03-14 DIAGNOSIS — E1169 Type 2 diabetes mellitus with other specified complication: Secondary | ICD-10-CM | POA: Diagnosis not present

## 2022-03-14 DIAGNOSIS — I10 Essential (primary) hypertension: Secondary | ICD-10-CM | POA: Diagnosis not present

## 2022-03-14 DIAGNOSIS — E7849 Other hyperlipidemia: Secondary | ICD-10-CM | POA: Diagnosis not present

## 2022-04-14 DIAGNOSIS — I129 Hypertensive chronic kidney disease with stage 1 through stage 4 chronic kidney disease, or unspecified chronic kidney disease: Secondary | ICD-10-CM | POA: Diagnosis not present

## 2022-04-14 DIAGNOSIS — N1832 Chronic kidney disease, stage 3b: Secondary | ICD-10-CM | POA: Diagnosis not present

## 2022-04-14 DIAGNOSIS — R0602 Shortness of breath: Secondary | ICD-10-CM | POA: Diagnosis not present

## 2022-04-14 DIAGNOSIS — E1169 Type 2 diabetes mellitus with other specified complication: Secondary | ICD-10-CM | POA: Diagnosis not present

## 2022-08-01 DIAGNOSIS — N1832 Chronic kidney disease, stage 3b: Secondary | ICD-10-CM | POA: Diagnosis not present

## 2022-08-01 DIAGNOSIS — I129 Hypertensive chronic kidney disease with stage 1 through stage 4 chronic kidney disease, or unspecified chronic kidney disease: Secondary | ICD-10-CM | POA: Diagnosis not present

## 2022-08-01 DIAGNOSIS — E782 Mixed hyperlipidemia: Secondary | ICD-10-CM | POA: Diagnosis not present

## 2022-08-01 DIAGNOSIS — M26601 Right temporomandibular joint disorder, unspecified: Secondary | ICD-10-CM | POA: Diagnosis not present

## 2022-09-08 ENCOUNTER — Encounter: Payer: Self-pay | Admitting: Neurology

## 2022-12-03 DIAGNOSIS — I129 Hypertensive chronic kidney disease with stage 1 through stage 4 chronic kidney disease, or unspecified chronic kidney disease: Secondary | ICD-10-CM | POA: Diagnosis not present

## 2022-12-03 DIAGNOSIS — M26601 Right temporomandibular joint disorder, unspecified: Secondary | ICD-10-CM | POA: Diagnosis not present

## 2022-12-18 DIAGNOSIS — I129 Hypertensive chronic kidney disease with stage 1 through stage 4 chronic kidney disease, or unspecified chronic kidney disease: Secondary | ICD-10-CM | POA: Diagnosis not present

## 2022-12-18 DIAGNOSIS — E1122 Type 2 diabetes mellitus with diabetic chronic kidney disease: Secondary | ICD-10-CM | POA: Diagnosis not present

## 2022-12-18 DIAGNOSIS — N1832 Chronic kidney disease, stage 3b: Secondary | ICD-10-CM | POA: Diagnosis not present

## 2022-12-25 ENCOUNTER — Other Ambulatory Visit: Payer: Self-pay | Admitting: Neurology

## 2022-12-26 NOTE — Progress Notes (Unsigned)
Follow-up Visit   Date: 12/30/22   DANNEL HARALDSON MRN: [id number] DOB: [birth year]   Interim History: Edgardo Roys Vanderhoef is a 77 y.o. left-handed male with prostate cancer, hypertension, and hyperlipidemia returning to the clinic for follow-up of bilateral feet pain.  The patient was accompanied to the clinic by self.  IMPRESSION/PLAN: Bilateral feet paresthesias, suggestive of early neuropathy.  However, NCS/EMG was normal in 2022.  He reports ongoing symptoms which may be slightly worse.  I will repeat NCS/EMG of the legs to assess for interval change.  Continue gabapentin 326m at bedtime, this can be titrated as needed.  Return to clinic in 1 year  ---------------------------------------------------------------- History of present illness: He complains of numbness and swollen sensation over the soles of the feet and toes.  He also has achy pain in the feet.  It is worse at night time.  He tried gabapentin 1058mand does not recall that it helped. He denies imbalance, weakness, or falls.    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  He feet symptoms are unchanged.  There was delaying in picking up his gabapentin, so he started the gabapentin 30065mast night and has not noticed any change.    UPDATE 06/26/2021:  He is here for follow-up visit.  He started using gabapentin 300m18mich helps him sleep better. He has intermittent spells of numbness and pain during the day, especially in the toes.   There is no specific pattern. Balance is good.  No weakness.  UPDATE 12/27/2021:  There has not been any significant change in his feet discomfort which remains worse at bedtime.  He has sharp pain involving the tips of the toes and numbness in the feet.  He denies falls or weakness.  He takes gabapentin 300mg39mbedtime and feels that it makes him more sleepy, than actually help with the pain.    UPDATE 12/30/2022: He  is here for follow-up.   He has pain involving the great toes and tight sensation over the balls of the feet.  He has sensitivity in the toes from bed sheets so often keeps his feet outside of the bed.  No problems with weakness, walking, or imbalance. 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     gabapentin (NEURONTIN) 300 MG capsule TAKE ONE CAPSULE BY MOUTH AT BEDTIME 90 capsule 3   hydrochlorothiazide (HYDRODIURIL) 25 MG tablet Take by mouth.     losartan (COZAAR) 100 MG tablet      metoprolol tartrate (LOPRESSOR) 50 MG tablet      potassium chloride SA (KLOR-CON) 20 MEQ tablet      rosuvastatin (CRESTOR) 10 MG tablet      No current facility-administered medications on file prior to visit.    Allergies: No Known Allergies  Vital Signs:  BP 127/83   Pulse 89   Ht 5' 8"$  (1.727 m)   Wt 186 lb (84.4 kg)   SpO2 100%   BMI 28.28 kg/m    Neurological Exam: MENTAL STATUS including orientation to time, place, person, recent and remote memory, attention span and concentration, language, and fund of knowledge is normal.  Speech is not dysarthric.  CRANIAL NERVES: No visual field defects. Pupils  equal round and reactive to light.  Normal conjugate, extra-ocular eye movements in all directions of gaze.  No ptosis.   MOTOR:  Motor strength is 5/5 in all extremities.  No atrophy, fasciculations or abnormal movements.  No pronator drift.  Tone is normal.    MSRs:  Reflexes are 2+/4 throughout, including ankles.  SENSORY:  Vibration is mildly reduced on the left great toe, compared to the right.  Otherwise sensation intact.  COORDINATION/GAIT:  Normal finger-to- nose-finger.  Intact rapid alternating movements bilaterally.  Gait narrow based and stable.    Data: NCS/EMG of the legs 04/10/2021:   Normal  Labs 04/10/2021:  ESR 12, SPEP with IFE faint IgG kappa band, TSH 1.4, folate 7.6, vitamin B12 363    Thank you for allowing me to participate in patient's care.  If I can answer any additional questions, I would be pleased to do so.    Sincerely,    Lanis Storlie K. Posey Pronto, DO

## 2022-12-29 ENCOUNTER — Ambulatory Visit: Payer: Medicare Other | Admitting: Neurology

## 2022-12-30 ENCOUNTER — Encounter: Payer: Self-pay | Admitting: Neurology

## 2022-12-30 ENCOUNTER — Ambulatory Visit: Payer: Medicare Other | Admitting: Neurology

## 2022-12-30 VITALS — BP 127/83 | HR 89 | Ht 68.0 in | Wt 186.0 lb

## 2022-12-30 DIAGNOSIS — R200 Anesthesia of skin: Secondary | ICD-10-CM

## 2022-12-30 DIAGNOSIS — R202 Paresthesia of skin: Secondary | ICD-10-CM

## 2022-12-30 NOTE — Patient Instructions (Signed)
Nerve testing of the legs  ELECTROMYOGRAM AND NERVE CONDUCTION STUDIES (EMG/NCS) INSTRUCTIONS  How to Prepare The neurologist conducting the EMG will need to know if you have certain medical conditions. Tell the neurologist and other EMG lab personnel if you: Have a pacemaker or any other electrical medical device Take blood-thinning medications Have hemophilia, a blood-clotting disorder that causes prolonged bleeding Bathing Take a shower or bath shortly before your exam in order to remove oils from your skin. Don't apply lotions or creams before the exam.  What to Expect You'll likely be asked to change into a hospital gown for the procedure and lie down on an examination table. The following explanations can help you understand what will happen during the exam.  Electrodes. The neurologist or a technician places surface electrodes at various locations on your skin depending on where you're experiencing symptoms. Or the neurologist may insert needle electrodes at different sites depending on your symptoms.  Sensations. The electrodes will at times transmit a tiny electrical current that you may feel as a twinge or spasm. The needle electrode may cause discomfort or pain that usually ends shortly after the needle is removed. If you are concerned about discomfort or pain, you may want to talk to the neurologist about taking a short break during the exam.  Instructions. During the needle EMG, the neurologist will assess whether there is any spontaneous electrical activity when the muscle is at rest - activity that isn't present in healthy muscle tissue - and the degree of activity when you slightly contract the muscle.  He or she will give you instructions on resting and contracting a muscle at appropriate times. Depending on what muscles and nerves the neurologist is examining, he or she may ask you to change positions during the exam.  After your EMG You may experience some temporary, minor  bruising where the needle electrode was inserted into your muscle. This bruising should fade within several days. If it persists, contact your primary care doctor.

## 2023-01-01 ENCOUNTER — Ambulatory Visit: Payer: Medicare Other | Admitting: Neurology

## 2023-01-01 DIAGNOSIS — R202 Paresthesia of skin: Secondary | ICD-10-CM

## 2023-01-01 DIAGNOSIS — R200 Anesthesia of skin: Secondary | ICD-10-CM

## 2023-01-01 NOTE — Procedures (Signed)
[facility]  [address]  [place], [place] [address] Tel: [phone] Fax: [phone] Test Date:  01/01/2023  Patient: Terry Costa DOB: 04/12/1946 Physician: Narda Amber, DO  Sex: Male Height: 5' 8"$  Ref Phys: Narda Amber, DO  ID#: [id number]   Technician:    History: This is a 77 year old man referred for evaluation of bilateral feet paresthesias.  NCV & EMG Findings: Electrodiagnostic testing of the right lower extremity and additional studies of the left shows: Bilateral sural and superficial peroneal sensory responses are within normal limits. Bilateral peroneal and tibial motor responses are within normal limits. Bilateral tibial H reflex studies are within normal limits. There is no evidence of active or chronic motor axonal changes affecting any of the tested muscles.  Motor unit configuration and recruitment pattern is within normal limits.   Impression: This is a normal study of the lower extremities.  In particular, there is no evidence of a large fiber sensorimotor polyneuropathy or lumbosacral radiculopathy.     ___________________________ Narda Amber, DO    Nerve Conduction Studies   Stim Site NR Peak (ms) Norm Peak (ms) O-P Amp (V) Norm O-P Amp  Left Sup Peroneal Anti Sensory (Ant Lat Mall)  32 C  12 cm    4.2 <4.6 5.2 >3  Right Sup Peroneal Anti Sensory (Ant Lat Mall)  32 C  12 cm    4.2 <4.6 7.6 >3  Left Sural Anti Sensory (Lat Mall)  32 C  Calf    4.5 <4.6 10.5 >3  Right Sural Anti Sensory (Lat Mall)  32 C  Calf    3.8 <4.6 10.4 >3     Stim Site NR Onset (ms) Norm Onset (ms) O-P Amp (mV) Norm O-P Amp Site1 Site2 Delta-0 (ms) Dist (cm) Vel (m/s) Norm Vel (m/s)  Left Peroneal Motor (Ext Dig Brev)  32 C  Ankle    5.2 <6.0 5.5 >2.5 B Fib Ankle 9.3 38.0 41 >40  B Fib    14.5  5.4  Poplt B Fib 1.7 8.0 47 >40  Poplt    16.2  5.4         Right Peroneal Motor (Ext Dig Brev)  32 C  Ankle    5.4 <6.0 3.9 >2.5 B Fib  Ankle 9.8 40.0 41 >40  B Fib    15.2  3.9  Poplt B Fib 2.0 8.0 40 >40  Poplt    17.2  3.9         Left Tibial Motor (Abd Hall Brev)  32 C  Ankle    4.3 <6.0 5.8 >4 Knee Ankle 10.8 43.0 40 >40  Knee    15.1  4.7         Right Tibial Motor (Abd Hall Brev)  32 C  Ankle    4.0 <6.0 7.0 >4 Knee Ankle 10.8 43.0 40 >40  Knee    14.8  4.8          Electromyography   Side Muscle Ins.Act Fibs Fasc Recrt Amp Dur Poly Activation Comment  Right Gastroc Nml Nml Nml Nml Nml Nml Nml Nml N/A  Right AntTibialis Nml Nml Nml Nml Nml Nml Nml Nml N/A  Right RectFemoris Nml Nml Nml Nml Nml Nml Nml Nml N/A  Right BicepsFemS Nml Nml Nml Nml Nml Nml Nml Nml N/A  Right Flex Dig Long Nml Nml Nml Nml Nml Nml Nml Nml N/A  Left GluteusMed Nml Nml Nml Nml Nml Nml Nml Nml N/A  Left AntTibialis Nml Nml Nml Nml Nml Nml Nml Nml N/A  Left Flex Dig Long Nml Nml Nml Nml Nml Nml Nml Nml N/A  Left Gastroc Nml Nml Nml Nml Nml Nml Nml Nml N/A  Left RectFemoris Nml Nml Nml Nml Nml Nml Nml Nml N/A  Left BicepsFemS Nml Nml Nml Nml Nml Nml Nml Nml N/A      Waveforms:

## 2023-02-04 DIAGNOSIS — E860 Dehydration: Secondary | ICD-10-CM | POA: Diagnosis not present

## 2023-02-04 DIAGNOSIS — R1084 Generalized abdominal pain: Secondary | ICD-10-CM | POA: Diagnosis not present

## 2023-02-04 DIAGNOSIS — I129 Hypertensive chronic kidney disease with stage 1 through stage 4 chronic kidney disease, or unspecified chronic kidney disease: Secondary | ICD-10-CM | POA: Diagnosis not present

## 2023-02-04 DIAGNOSIS — I2510 Atherosclerotic heart disease of native coronary artery without angina pectoris: Secondary | ICD-10-CM | POA: Diagnosis not present

## 2023-02-04 DIAGNOSIS — K56609 Unspecified intestinal obstruction, unspecified as to partial versus complete obstruction: Secondary | ICD-10-CM | POA: Diagnosis not present

## 2023-02-04 DIAGNOSIS — I10 Essential (primary) hypertension: Secondary | ICD-10-CM | POA: Diagnosis not present

## 2023-02-04 DIAGNOSIS — K6389 Other specified diseases of intestine: Secondary | ICD-10-CM | POA: Diagnosis not present

## 2023-02-04 DIAGNOSIS — I700 Atherosclerosis of aorta: Secondary | ICD-10-CM | POA: Diagnosis not present

## 2023-02-04 DIAGNOSIS — K760 Fatty (change of) liver, not elsewhere classified: Secondary | ICD-10-CM | POA: Diagnosis not present

## 2023-02-04 DIAGNOSIS — K529 Noninfective gastroenteritis and colitis, unspecified: Secondary | ICD-10-CM | POA: Diagnosis not present

## 2023-02-04 DIAGNOSIS — R109 Unspecified abdominal pain: Secondary | ICD-10-CM | POA: Diagnosis not present

## 2023-02-04 DIAGNOSIS — K56600 Partial intestinal obstruction, unspecified as to cause: Secondary | ICD-10-CM | POA: Diagnosis not present

## 2023-02-04 DIAGNOSIS — A084 Viral intestinal infection, unspecified: Secondary | ICD-10-CM | POA: Diagnosis not present

## 2023-02-04 DIAGNOSIS — N179 Acute kidney failure, unspecified: Secondary | ICD-10-CM | POA: Diagnosis not present

## 2023-02-04 DIAGNOSIS — E782 Mixed hyperlipidemia: Secondary | ICD-10-CM | POA: Diagnosis not present

## 2023-02-04 DIAGNOSIS — N183 Chronic kidney disease, stage 3 unspecified: Secondary | ICD-10-CM | POA: Diagnosis not present

## 2023-02-10 DIAGNOSIS — A084 Viral intestinal infection, unspecified: Secondary | ICD-10-CM | POA: Diagnosis not present

## 2023-02-10 DIAGNOSIS — R1084 Generalized abdominal pain: Secondary | ICD-10-CM | POA: Diagnosis not present

## 2023-02-13 DIAGNOSIS — I10 Essential (primary) hypertension: Secondary | ICD-10-CM | POA: Diagnosis not present

## 2023-02-13 DIAGNOSIS — N179 Acute kidney failure, unspecified: Secondary | ICD-10-CM | POA: Diagnosis not present

## 2023-02-16 IMAGING — DX DG HIP (WITH OR WITHOUT PELVIS) 2-3V*R*
2 series · 2 of 2 positions shown · non-contrast
Comparison: None.

CLINICAL DATA: Pain right hip

EXAM:
DG HIP (WITH OR WITHOUT PELVIS) 2-3V RIGHT

[dg hip unilat w or w/o pelvis 2-3 views  (1 of 2)]
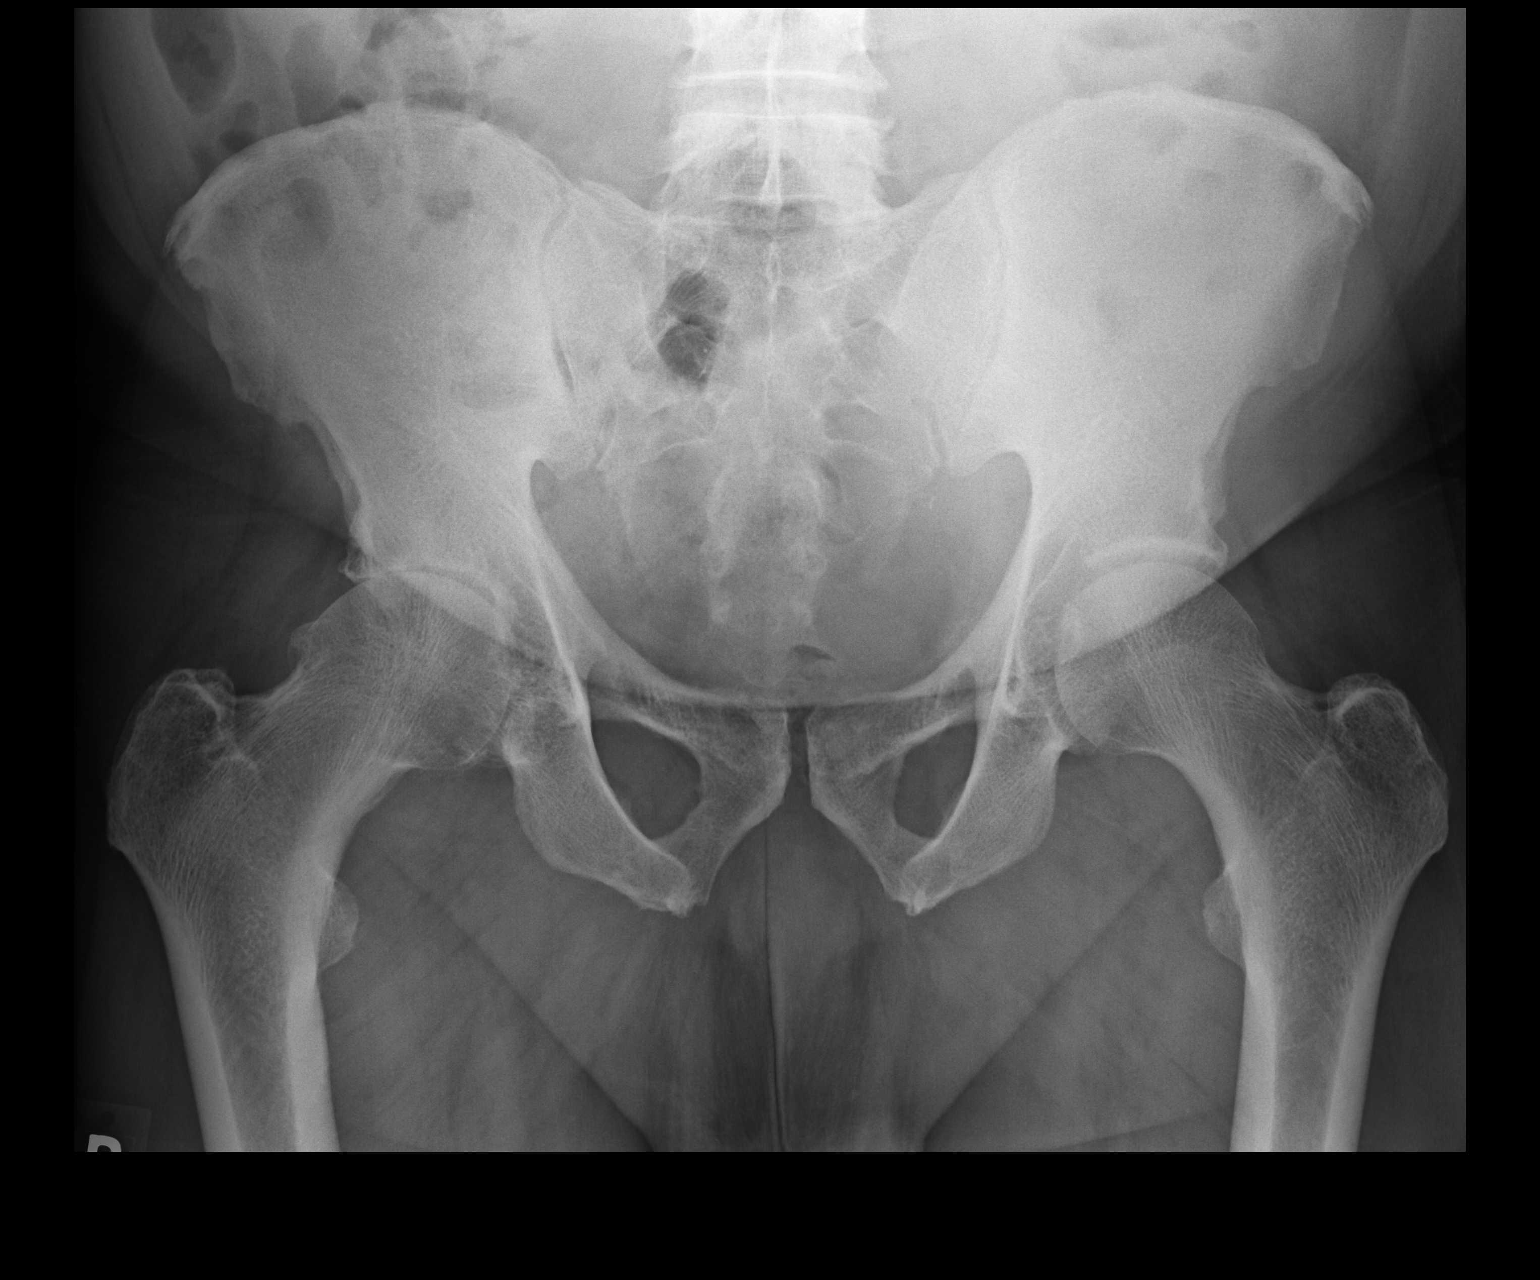

[dg hip unilat w or w/o pelvis 2-3 views  (2 of 2)]
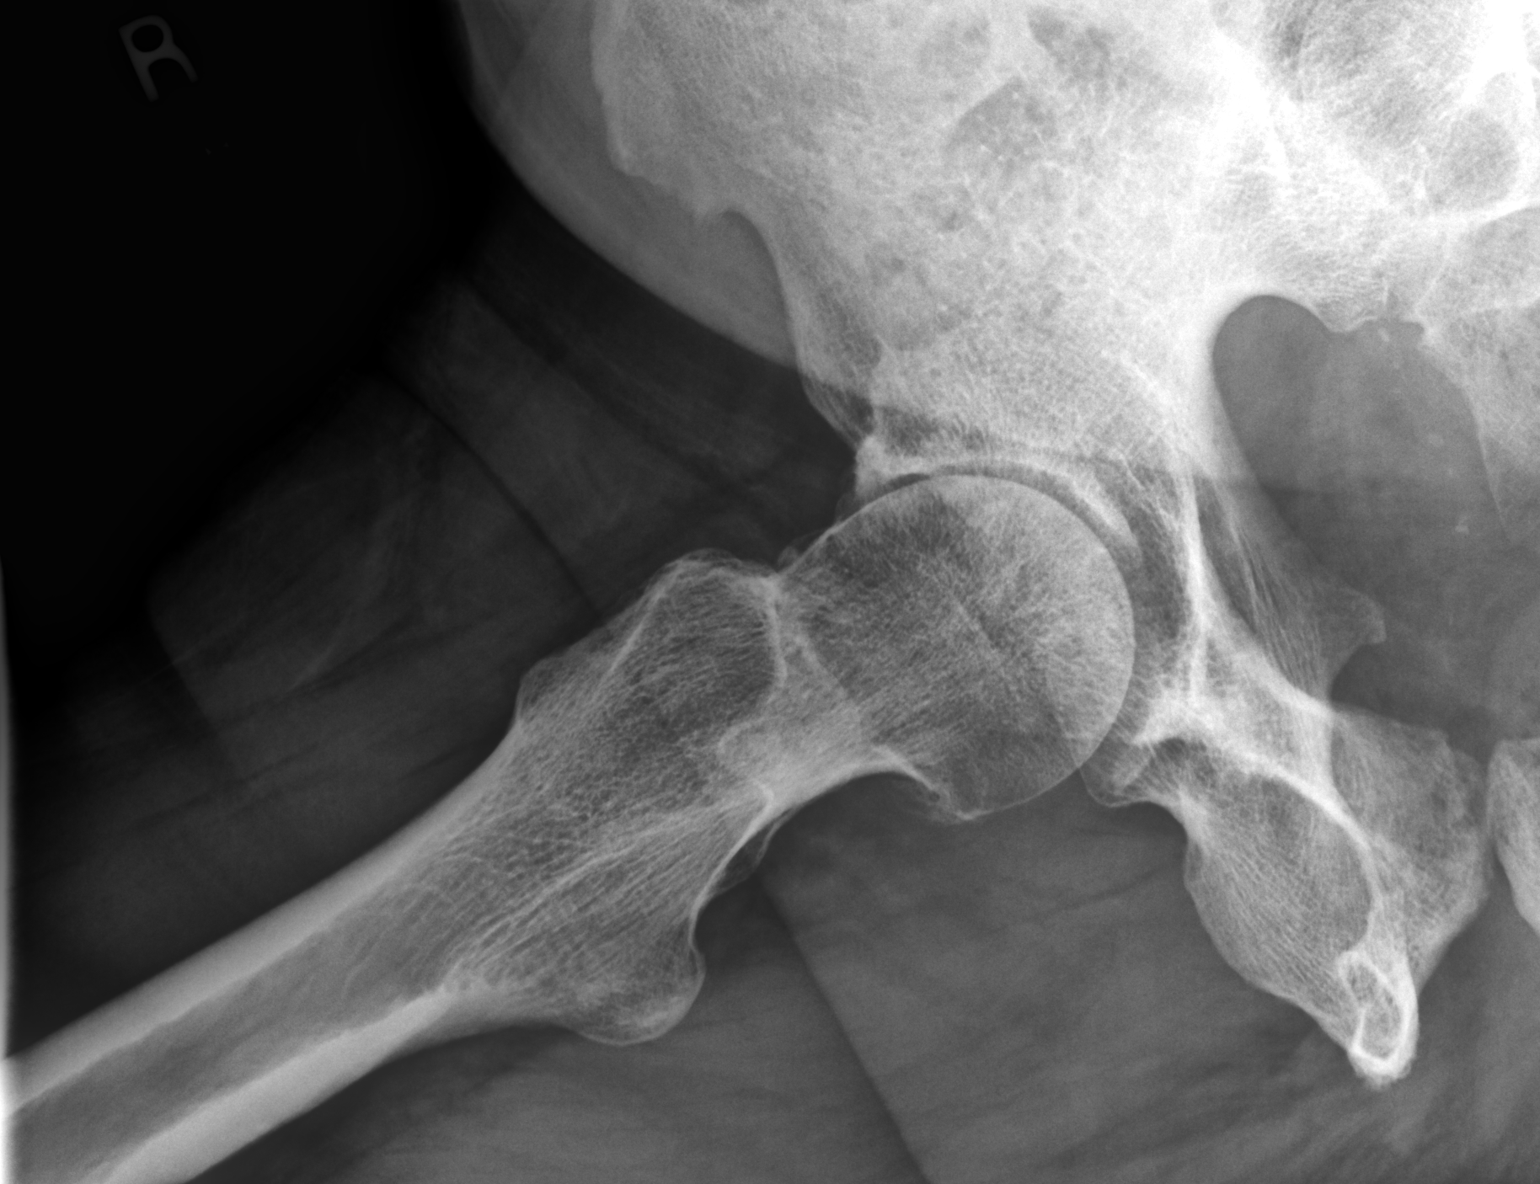

[2 of 2 positions shown; findings below may reference images not displayed]

FINDINGS: No recent fracture or dislocation is seen in the right hip.
Degenerative changes are noted with joint space narrowing and bony
spurs in the right hip.
IMPRESSION: No recent fracture is seen in the right hip. Moderate to marked
degenerative changes are noted in the right hip.

## 2023-02-18 DIAGNOSIS — I10 Essential (primary) hypertension: Secondary | ICD-10-CM | POA: Diagnosis not present

## 2023-03-26 DIAGNOSIS — E559 Vitamin D deficiency, unspecified: Secondary | ICD-10-CM | POA: Diagnosis not present

## 2023-03-26 DIAGNOSIS — N1832 Chronic kidney disease, stage 3b: Secondary | ICD-10-CM | POA: Diagnosis not present

## 2023-03-26 DIAGNOSIS — I129 Hypertensive chronic kidney disease with stage 1 through stage 4 chronic kidney disease, or unspecified chronic kidney disease: Secondary | ICD-10-CM | POA: Diagnosis not present

## 2023-03-26 DIAGNOSIS — E782 Mixed hyperlipidemia: Secondary | ICD-10-CM | POA: Diagnosis not present

## 2023-03-26 DIAGNOSIS — E1169 Type 2 diabetes mellitus with other specified complication: Secondary | ICD-10-CM | POA: Diagnosis not present

## 2023-04-10 DIAGNOSIS — E1140 Type 2 diabetes mellitus with diabetic neuropathy, unspecified: Secondary | ICD-10-CM | POA: Diagnosis not present

## 2023-04-10 DIAGNOSIS — E785 Hyperlipidemia, unspecified: Secondary | ICD-10-CM | POA: Diagnosis not present

## 2023-04-10 DIAGNOSIS — I10 Essential (primary) hypertension: Secondary | ICD-10-CM | POA: Diagnosis not present

## 2023-04-17 DIAGNOSIS — N189 Chronic kidney disease, unspecified: Secondary | ICD-10-CM | POA: Diagnosis not present

## 2023-04-17 DIAGNOSIS — E1169 Type 2 diabetes mellitus with other specified complication: Secondary | ICD-10-CM | POA: Diagnosis not present

## 2023-04-17 DIAGNOSIS — I10 Essential (primary) hypertension: Secondary | ICD-10-CM | POA: Diagnosis not present

## 2023-04-17 DIAGNOSIS — E7849 Other hyperlipidemia: Secondary | ICD-10-CM | POA: Diagnosis not present

## 2023-04-17 DIAGNOSIS — Z0001 Encounter for general adult medical examination with abnormal findings: Secondary | ICD-10-CM | POA: Diagnosis not present

## 2023-04-17 DIAGNOSIS — E785 Hyperlipidemia, unspecified: Secondary | ICD-10-CM | POA: Diagnosis not present

## 2023-04-17 DIAGNOSIS — G5793 Unspecified mononeuropathy of bilateral lower limbs: Secondary | ICD-10-CM | POA: Diagnosis not present

## 2023-05-10 DIAGNOSIS — E785 Hyperlipidemia, unspecified: Secondary | ICD-10-CM | POA: Diagnosis not present

## 2023-05-10 DIAGNOSIS — I10 Essential (primary) hypertension: Secondary | ICD-10-CM | POA: Diagnosis not present

## 2023-05-10 DIAGNOSIS — E1140 Type 2 diabetes mellitus with diabetic neuropathy, unspecified: Secondary | ICD-10-CM | POA: Diagnosis not present

## 2023-06-22 DIAGNOSIS — R3912 Poor urinary stream: Secondary | ICD-10-CM | POA: Diagnosis not present

## 2023-06-22 DIAGNOSIS — R350 Frequency of micturition: Secondary | ICD-10-CM | POA: Diagnosis not present

## 2023-07-20 DIAGNOSIS — Z923 Personal history of irradiation: Secondary | ICD-10-CM | POA: Diagnosis not present

## 2023-07-27 DIAGNOSIS — R9389 Abnormal findings on diagnostic imaging of other specified body structures: Secondary | ICD-10-CM | POA: Diagnosis not present

## 2023-07-31 DIAGNOSIS — Z923 Personal history of irradiation: Secondary | ICD-10-CM | POA: Diagnosis not present

## 2023-08-14 DIAGNOSIS — E1122 Type 2 diabetes mellitus with diabetic chronic kidney disease: Secondary | ICD-10-CM | POA: Diagnosis not present

## 2023-08-14 DIAGNOSIS — M6284 Sarcopenia: Secondary | ICD-10-CM | POA: Diagnosis not present

## 2023-08-14 DIAGNOSIS — G4700 Insomnia, unspecified: Secondary | ICD-10-CM | POA: Diagnosis not present

## 2023-08-14 DIAGNOSIS — Z23 Encounter for immunization: Secondary | ICD-10-CM | POA: Diagnosis not present

## 2023-08-14 DIAGNOSIS — G629 Polyneuropathy, unspecified: Secondary | ICD-10-CM | POA: Diagnosis not present

## 2023-08-14 DIAGNOSIS — I10 Essential (primary) hypertension: Secondary | ICD-10-CM | POA: Diagnosis not present

## 2023-08-14 DIAGNOSIS — I129 Hypertensive chronic kidney disease with stage 1 through stage 4 chronic kidney disease, or unspecified chronic kidney disease: Secondary | ICD-10-CM | POA: Diagnosis not present

## 2023-08-14 DIAGNOSIS — N1832 Chronic kidney disease, stage 3b: Secondary | ICD-10-CM | POA: Diagnosis not present

## 2023-08-14 DIAGNOSIS — E785 Hyperlipidemia, unspecified: Secondary | ICD-10-CM | POA: Diagnosis not present

## 2023-08-14 DIAGNOSIS — E079 Disorder of thyroid, unspecified: Secondary | ICD-10-CM | POA: Diagnosis not present

## 2023-09-14 DIAGNOSIS — L89104 Pressure ulcer of unspecified part of back, stage 4: Secondary | ICD-10-CM | POA: Diagnosis not present

## 2023-09-14 DIAGNOSIS — M1712 Unilateral primary osteoarthritis, left knee: Secondary | ICD-10-CM | POA: Diagnosis not present

## 2023-09-14 DIAGNOSIS — M25532 Pain in left wrist: Secondary | ICD-10-CM | POA: Diagnosis not present

## 2023-09-14 DIAGNOSIS — M1711 Unilateral primary osteoarthritis, right knee: Secondary | ICD-10-CM | POA: Diagnosis not present

## 2023-09-14 DIAGNOSIS — M25531 Pain in right wrist: Secondary | ICD-10-CM | POA: Diagnosis not present

## 2023-09-14 DIAGNOSIS — M545 Low back pain, unspecified: Secondary | ICD-10-CM | POA: Diagnosis not present

## 2023-10-16 DIAGNOSIS — M1712 Unilateral primary osteoarthritis, left knee: Secondary | ICD-10-CM | POA: Diagnosis not present

## 2023-10-16 DIAGNOSIS — M545 Low back pain, unspecified: Secondary | ICD-10-CM | POA: Diagnosis not present

## 2023-10-16 DIAGNOSIS — M1711 Unilateral primary osteoarthritis, right knee: Secondary | ICD-10-CM | POA: Diagnosis not present

## 2023-10-16 DIAGNOSIS — L89104 Pressure ulcer of unspecified part of back, stage 4: Secondary | ICD-10-CM | POA: Diagnosis not present

## 2023-10-16 DIAGNOSIS — M25532 Pain in left wrist: Secondary | ICD-10-CM | POA: Diagnosis not present

## 2023-10-16 DIAGNOSIS — M25531 Pain in right wrist: Secondary | ICD-10-CM | POA: Diagnosis not present

## 2023-10-19 DIAGNOSIS — N35819 Other urethral stricture, male, unspecified site: Secondary | ICD-10-CM | POA: Diagnosis not present

## 2023-10-19 DIAGNOSIS — N3281 Overactive bladder: Secondary | ICD-10-CM | POA: Diagnosis not present

## 2023-10-19 DIAGNOSIS — R109 Unspecified abdominal pain: Secondary | ICD-10-CM | POA: Diagnosis not present

## 2023-11-13 ENCOUNTER — Ambulatory Visit
Admission: RE | Admit: 2023-11-13 | Discharge: 2023-11-13 | Disposition: A | Discharge status: Home / Self Care | Payer: Medicare Other | Source: Ambulatory Visit | Attending: Family Medicine | Admitting: Family Medicine

## 2023-11-13 ENCOUNTER — Other Ambulatory Visit: Payer: Self-pay | Admitting: Family Medicine

## 2023-11-13 DIAGNOSIS — I129 Hypertensive chronic kidney disease with stage 1 through stage 4 chronic kidney disease, or unspecified chronic kidney disease: Secondary | ICD-10-CM | POA: Diagnosis not present

## 2023-11-13 DIAGNOSIS — M5459 Other low back pain: Secondary | ICD-10-CM | POA: Diagnosis not present

## 2023-11-13 DIAGNOSIS — M1611 Unilateral primary osteoarthritis, right hip: Secondary | ICD-10-CM | POA: Diagnosis not present

## 2023-11-13 DIAGNOSIS — R100 Acute abdomen: Secondary | ICD-10-CM | POA: Diagnosis not present

## 2023-11-13 DIAGNOSIS — M545 Low back pain, unspecified: Secondary | ICD-10-CM

## 2023-11-13 DIAGNOSIS — E1122 Type 2 diabetes mellitus with diabetic chronic kidney disease: Secondary | ICD-10-CM | POA: Diagnosis not present

## 2023-11-13 DIAGNOSIS — N1832 Chronic kidney disease, stage 3b: Secondary | ICD-10-CM | POA: Diagnosis not present

## 2023-12-03 DIAGNOSIS — J399 Disease of upper respiratory tract, unspecified: Secondary | ICD-10-CM | POA: Diagnosis not present

## 2023-12-03 DIAGNOSIS — I10 Essential (primary) hypertension: Secondary | ICD-10-CM | POA: Diagnosis not present

## 2023-12-03 DIAGNOSIS — N189 Chronic kidney disease, unspecified: Secondary | ICD-10-CM | POA: Diagnosis not present

## 2023-12-03 DIAGNOSIS — E1169 Type 2 diabetes mellitus with other specified complication: Secondary | ICD-10-CM | POA: Diagnosis not present

## 2023-12-03 DIAGNOSIS — M130 Polyarthritis, unspecified: Secondary | ICD-10-CM | POA: Diagnosis not present

## 2023-12-03 DIAGNOSIS — G629 Polyneuropathy, unspecified: Secondary | ICD-10-CM | POA: Diagnosis not present

## 2023-12-20 ENCOUNTER — Other Ambulatory Visit: Payer: Self-pay | Admitting: Neurology

## 2024-01-04 ENCOUNTER — Ambulatory Visit: Payer: Medicare Other | Admitting: Neurology

## 2024-01-18 ENCOUNTER — Ambulatory Visit: Payer: Medicare Other | Admitting: Neurology

## 2024-01-26 DIAGNOSIS — N35819 Other urethral stricture, male, unspecified site: Secondary | ICD-10-CM | POA: Diagnosis not present

## 2024-01-26 DIAGNOSIS — R109 Unspecified abdominal pain: Secondary | ICD-10-CM | POA: Diagnosis not present

## 2024-02-02 DIAGNOSIS — N35819 Other urethral stricture, male, unspecified site: Secondary | ICD-10-CM | POA: Diagnosis not present

## 2024-02-02 DIAGNOSIS — N3281 Overactive bladder: Secondary | ICD-10-CM | POA: Diagnosis not present

## 2024-03-03 DIAGNOSIS — E1140 Type 2 diabetes mellitus with diabetic neuropathy, unspecified: Secondary | ICD-10-CM | POA: Diagnosis not present

## 2024-03-03 DIAGNOSIS — N189 Chronic kidney disease, unspecified: Secondary | ICD-10-CM | POA: Diagnosis not present

## 2024-03-03 DIAGNOSIS — I129 Hypertensive chronic kidney disease with stage 1 through stage 4 chronic kidney disease, or unspecified chronic kidney disease: Secondary | ICD-10-CM | POA: Diagnosis not present

## 2024-03-03 DIAGNOSIS — I10 Essential (primary) hypertension: Secondary | ICD-10-CM | POA: Diagnosis not present

## 2024-03-03 DIAGNOSIS — G4700 Insomnia, unspecified: Secondary | ICD-10-CM | POA: Diagnosis not present

## 2024-03-16 ENCOUNTER — Other Ambulatory Visit: Payer: Self-pay | Admitting: Neurology

## 2024-03-18 ENCOUNTER — Telehealth: Payer: Self-pay

## 2024-03-18 NOTE — Progress Notes (Signed)
   03/18/2024  Patient ID: Terry Costa, male   DOB: [birth year], 78 y.o.   MRN: [id number]  Contacted patient regarding referral for medication access from Jonathon Neighbors, MD .   Left patient a voicemail to return my call at their convenience  Livia Riffle, PharmD Clinical Pharmacist  [phone]

## 2024-03-24 ENCOUNTER — Telehealth: Payer: Self-pay

## 2024-03-24 ENCOUNTER — Other Ambulatory Visit (HOSPITAL_COMMUNITY): Payer: Self-pay

## 2024-03-24 NOTE — Progress Notes (Signed)
   03/24/2024  Patient ID: Terry Costa, male   DOB: September 29, 1946, 78 y.o.   MRN: [id number]  Contacted patient regarding referral for medication access from Jonathon Neighbors, MD .   2025 Medication Assistance Renewal Application Summary:  Patient was outreached regarding medication assistance renewal for 2025. Verified address, anticipated insurance for 2025, and income has not changed. Patient remains interested in PAP for 2025 for Farxiga, no other new medications were identified for medication assistance.   Medication Assistance Findings:  Medication assistance needs identified: Farxiga     Additional medication assistance options reviewed with patient as warranted:  No other options identified  Plan: I will route patient assistance letter to pharmacy technician who will coordinate patient assistance program application process for medications listed above.  Pharmacy technician will assist with obtaining all required documents from both patient and provider(s) and submit application(s) once completed.    Thank you for allowing pharmacy to be a part of this patient's care.    Livia Riffle, PharmD Clinical Pharmacist  [phone]

## 2024-03-24 NOTE — Telephone Encounter (Signed)
 PAP: Patient assistance application for Farxiga through [employer] ([employer]) has been mailed to pt's home address on file. Provider portion of application will be faxed to provider's office.   The provider portion of tha application has been sent to Dr. Jonathon Neighbors at [facility] for review

## 2024-04-07 NOTE — Progress Notes (Signed)
   04/07/2024  Patient ID: Terry Costa, male   DOB: 1946/05/30, 78 y.o.   MRN: [id number]  Contacted patient regarding referral for medication access from Jonathon Neighbors, MD .   Spoke to patient today, he reports receiving the application a couple days ago. He will sign and mail back to us  as soon as possible.   We also discussed his medication adherence. He had a surplus of Farxiga from 2024 PAP enrollment, and is filling losartan and atorvastatin for 90-day supplies at [facility].   Recent Fill Dates Losartan 100 mg - last filled 03/16/24 for a 90-day supply Atorvastatin 20 mg - last filled 12/30/23 for a 90-day supply  Discussed statin adherence with patient, he authorized refill. Assisted with calling into pharmacy.   Thank you for allowing pharmacy to be a part of this patient's care.    Livia Riffle, PharmD Clinical Pharmacist  [phone]

## 2024-04-13 NOTE — Telephone Encounter (Signed)
 Pt. Received application and will be mailing back this week

## 2024-04-15 NOTE — Telephone Encounter (Signed)
 Received patient portion of patient assistance application, will refax provider portion

## 2024-05-26 NOTE — Progress Notes (Addendum)
   05/26/2024  Patient ID: Terry Costa, male   DOB: [birth year], 78 y.o.   MRN: [id number]  Contacted patient regarding referral for medication access from Benjamine Aland, MD .   Spoke to patient today, he reports having a 2 week supply of the medication remaining. We have received the pt portion of his PAP and are awaiting the provider portion which I will get signed today and submit to [place].   I will follow up with PAP approval.   Thank you for allowing pharmacy to be a part of this patient's care.  Heather Factor, PharmD Clinical Pharmacist  [phone]

## 2024-06-08 NOTE — Telephone Encounter (Signed)
 PAP: Patient assistance application for Farxiga has been approved by PAP Companies: [employer] from 06/08/2024 to 11/09/2024. Medication should be delivered to PAP Delivery: Home. For further shipping updates, please contact [employer] ([employer]) at [phone]. Patient ID is: [id number]

## 2024-06-08 NOTE — Telephone Encounter (Signed)
 PAP: Application for Doreen has been submitted to [employer]), via fax

## 2024-06-08 NOTE — Progress Notes (Signed)
 Pharmacy Medication Assistance Program Note    06/08/2024  Patient ID: Terry Costa, male   DOB: 27-Aug-1946, 78 y.o.   MRN: [id number]     03/24/2024  Outreach Medication One  Initial Outreach Date (Medication One) 03/24/2024  Manufacturer Medication One [employer]  [employer] [facility] Farxiga  Dose of Farxiga 10mg   Type of Assistance Manufacturer Assistance  Date Application Sent to Patient 03/28/2024  Application Items Requested Application  Date Application Sent to Prescriber 03/24/2024  Name of Prescriber Kennieth Leech  Date Application Received From Patient 04/15/2024  Application Items Received From Patient Application  Date Application Received From Provider 06/08/2024  Date Application Submitted to Manufacturer 06/08/2024  Method Application Sent to Manufacturer Fax  Patient Assistance Determination Approved  Approval Start Date 06/08/2024  Approval End Date 11/09/2024  Patient Notification Method Telephone Call     Signature

## 2024-06-09 NOTE — Progress Notes (Signed)
   06/09/2024  Patient ID: Terry Costa, male   DOB: 04-21-1946, 78 y.o.   MRN: [id number]  Contacted patient regarding referral for medication access from Benjamine Aland, MD .   Contacted patient to inform of PAP approval for Farxiga 10 mg through 11/09/24. The patient reports having 1 tablet remaining of this medication. We have requested expedited shipping from [employer], but will provide the patient a 7-day sample of the medication to hold him until his shipment arrives. He has been instructed to contact me if he doesn't receive his shipment in the next 5 business days.    Thank you for allowing pharmacy to be a part of this patient's care.  Heather Factor, PharmD Clinical Pharmacist  [phone]

## 2024-07-04 DIAGNOSIS — I10 Essential (primary) hypertension: Secondary | ICD-10-CM | POA: Diagnosis not present

## 2024-07-04 DIAGNOSIS — G4700 Insomnia, unspecified: Secondary | ICD-10-CM | POA: Diagnosis not present

## 2024-07-04 DIAGNOSIS — E1169 Type 2 diabetes mellitus with other specified complication: Secondary | ICD-10-CM | POA: Diagnosis not present

## 2024-07-04 DIAGNOSIS — E1140 Type 2 diabetes mellitus with diabetic neuropathy, unspecified: Secondary | ICD-10-CM | POA: Diagnosis not present

## 2024-07-21 ENCOUNTER — Telehealth: Payer: Self-pay

## 2024-07-21 NOTE — Telephone Encounter (Signed)
 Opened in error

## 2024-07-26 ENCOUNTER — Telehealth: Payer: Self-pay

## 2024-07-26 NOTE — Telephone Encounter (Signed)
 2026 Renewal  PAP: Patient assistance application for Farxiga through [employer] ([employer]) has been mailed to pt's home address on file. Provider portion of application will be faxed to provider's office.   Provider portion of application will be faxed to Dr. Kennieth Leech at [facility]

## 2024-08-15 DIAGNOSIS — I10 Essential (primary) hypertension: Secondary | ICD-10-CM | POA: Diagnosis not present

## 2024-08-15 DIAGNOSIS — Z23 Encounter for immunization: Secondary | ICD-10-CM | POA: Diagnosis not present

## 2024-08-15 DIAGNOSIS — I129 Hypertensive chronic kidney disease with stage 1 through stage 4 chronic kidney disease, or unspecified chronic kidney disease: Secondary | ICD-10-CM | POA: Diagnosis not present

## 2024-08-15 DIAGNOSIS — N189 Chronic kidney disease, unspecified: Secondary | ICD-10-CM | POA: Diagnosis not present

## 2024-08-15 DIAGNOSIS — R7303 Prediabetes: Secondary | ICD-10-CM | POA: Diagnosis not present

## 2024-08-15 DIAGNOSIS — M130 Polyarthritis, unspecified: Secondary | ICD-10-CM | POA: Diagnosis not present

## 2024-08-15 DIAGNOSIS — G629 Polyneuropathy, unspecified: Secondary | ICD-10-CM | POA: Diagnosis not present

## 2024-08-15 DIAGNOSIS — E782 Mixed hyperlipidemia: Secondary | ICD-10-CM | POA: Diagnosis not present

## 2024-08-20 NOTE — Telephone Encounter (Signed)
 Received patient portion of patient assistance application

## 2024-08-25 ENCOUNTER — Other Ambulatory Visit (HOSPITAL_COMMUNITY): Payer: Self-pay

## 2024-08-26 NOTE — Telephone Encounter (Signed)
 PAP: Application for Terry Costa has been submitted to [employer] via fax

## 2024-08-30 NOTE — Progress Notes (Signed)
 Pharmacy Medication Assistance Program Note    08/30/2024  Patient ID: Terry Costa, male   DOB: 1946/05/15, 78 y.o.   MRN: [id number]     03/24/2024 07/26/2024  Outreach Medication One  Initial Outreach Date (Medication One) 03/24/2024 07/26/2024  Manufacturer Medication One [employer]  [employer] [facility] Doreen Farxiga  Dose of Farxiga 10mg  10mg   Type of Forensic scientist Assistance  Date Application Sent to Patient 03/28/2024 07/28/2024  Application Items Requested Application Application  Date Application Sent to Prescriber 03/24/2024 08/11/2024  Name of Prescriber Kennieth Benjamine Kennieth Benjamine  Date Application Received From Patient 04/15/2024 08/20/2024  Application Items Received From Patient Application Application  Date Application Received From Provider 06/08/2024 08/26/2024  Date Application Submitted to Manufacturer 06/08/2024 08/26/2024  Method Application Sent to Manufacturer Fax Fax  Patient Assistance Determination Approved Approved  Approval Start Date 06/08/2024 11/10/2024  Approval End Date 11/09/2024 11/09/2025  Patient Notification Method Telephone Call Telephone Call     Signature

## 2024-08-30 NOTE — Telephone Encounter (Signed)
 PAP: Patient assistance application for Farxiga has been approved by PAP Companies: [employer] from 11/10/2024 to 11/09/2025. Medication should be delivered to PAP Delivery: Home. For further shipping updates, please contact [employer] ([employer]) at [phone]. Patient ID is: [id number]
